# Patient Record
Sex: Male | Born: 1955 | Race: Black or African American | Hispanic: No | Smoking: Current every day smoker
Health system: Southern US, Community
[De-identification: ages and names within clinical notes are randomized; demographics above are authoritative.]

## PROBLEM LIST (undated history)

## (undated) DIAGNOSIS — E7140 Disorder of carnitine metabolism, unspecified: Secondary | ICD-10-CM

## (undated) DIAGNOSIS — E785 Hyperlipidemia, unspecified: Secondary | ICD-10-CM

## (undated) DIAGNOSIS — G9340 Encephalopathy, unspecified: Secondary | ICD-10-CM

## (undated) DIAGNOSIS — K219 Gastro-esophageal reflux disease without esophagitis: Secondary | ICD-10-CM

## (undated) DIAGNOSIS — M109 Gout, unspecified: Secondary | ICD-10-CM

## (undated) DIAGNOSIS — F22 Delusional disorders: Secondary | ICD-10-CM

## (undated) DIAGNOSIS — I10 Essential (primary) hypertension: Secondary | ICD-10-CM

## (undated) DIAGNOSIS — F259 Schizoaffective disorder, unspecified: Secondary | ICD-10-CM

## (undated) DIAGNOSIS — G259 Extrapyramidal and movement disorder, unspecified: Secondary | ICD-10-CM

## (undated) DIAGNOSIS — E58 Dietary calcium deficiency: Secondary | ICD-10-CM

## (undated) DIAGNOSIS — K5900 Constipation, unspecified: Secondary | ICD-10-CM

---

## 2014-12-12 ENCOUNTER — Emergency Department: Payer: Self-pay | Admitting: Emergency Medicine

## 2015-01-15 ENCOUNTER — Other Ambulatory Visit: Admit: 2015-01-15 | Disposition: A | Discharge status: Home / Self Care | Payer: Self-pay

## 2015-01-15 LAB — BASIC METABOLIC PANEL
ANION GAP: 3 — AB (ref 7–16)
BUN: 28 mg/dL — ABNORMAL HIGH
CALCIUM: 9.2 mg/dL
Chloride: 113 mmol/L — ABNORMAL HIGH
Co2: 27 mmol/L
Creatinine: 6.81 mg/dL — ABNORMAL HIGH
EGFR (African American): 9 — ABNORMAL LOW
GFR CALC NON AF AMER: 8 — AB
GLUCOSE: 92 mg/dL
Potassium: 4.8 mmol/L
Sodium: 143 mmol/L

## 2015-01-19 ENCOUNTER — Other Ambulatory Visit: Admit: 2015-01-19 | Disposition: A | Discharge status: Home / Self Care | Payer: Self-pay

## 2015-01-19 LAB — URINALYSIS, COMPLETE
BILIRUBIN, UR: NEGATIVE
Glucose,UR: NEGATIVE mg/dL (ref 0–75)
NITRITE: NEGATIVE
Ph: 6 (ref 4.5–8.0)
Protein: NEGATIVE
Specific Gravity: 1.006 (ref 1.003–1.030)

## 2015-01-21 LAB — URINE CULTURE

## 2015-02-06 ENCOUNTER — Encounter: Payer: Self-pay | Admitting: *Deleted

## 2015-02-06 ENCOUNTER — Emergency Department: Payer: Medicare Other

## 2015-02-06 ENCOUNTER — Inpatient Hospital Stay
Admission: EM | Admit: 2015-02-06 | Discharge: 2015-02-27 | DRG: 871 | Disposition: E | Payer: Medicare Other | Attending: Specialist | Admitting: Specialist

## 2015-02-06 ENCOUNTER — Inpatient Hospital Stay: Payer: Medicare Other

## 2015-02-06 DIAGNOSIS — E870 Hyperosmolality and hypernatremia: Secondary | ICD-10-CM | POA: Diagnosis present

## 2015-02-06 DIAGNOSIS — J9600 Acute respiratory failure, unspecified whether with hypoxia or hypercapnia: Secondary | ICD-10-CM | POA: Diagnosis not present

## 2015-02-06 DIAGNOSIS — M109 Gout, unspecified: Secondary | ICD-10-CM | POA: Diagnosis present

## 2015-02-06 DIAGNOSIS — Z9911 Dependence on respirator [ventilator] status: Secondary | ICD-10-CM | POA: Diagnosis not present

## 2015-02-06 DIAGNOSIS — Z66 Do not resuscitate: Secondary | ICD-10-CM | POA: Diagnosis not present

## 2015-02-06 DIAGNOSIS — D638 Anemia in other chronic diseases classified elsewhere: Secondary | ICD-10-CM | POA: Diagnosis present

## 2015-02-06 DIAGNOSIS — Z79899 Other long term (current) drug therapy: Secondary | ICD-10-CM

## 2015-02-06 DIAGNOSIS — Z91013 Allergy to seafood: Secondary | ICD-10-CM

## 2015-02-06 DIAGNOSIS — I959 Hypotension, unspecified: Secondary | ICD-10-CM

## 2015-02-06 DIAGNOSIS — E872 Acidosis: Secondary | ICD-10-CM | POA: Diagnosis present

## 2015-02-06 DIAGNOSIS — A419 Sepsis, unspecified organism: Principal | ICD-10-CM | POA: Diagnosis present

## 2015-02-06 DIAGNOSIS — Z515 Encounter for palliative care: Secondary | ICD-10-CM | POA: Diagnosis not present

## 2015-02-06 DIAGNOSIS — G9340 Encephalopathy, unspecified: Secondary | ICD-10-CM | POA: Diagnosis present

## 2015-02-06 DIAGNOSIS — K922 Gastrointestinal hemorrhage, unspecified: Secondary | ICD-10-CM | POA: Diagnosis present

## 2015-02-06 DIAGNOSIS — F17210 Nicotine dependence, cigarettes, uncomplicated: Secondary | ICD-10-CM | POA: Diagnosis present

## 2015-02-06 DIAGNOSIS — E860 Dehydration: Secondary | ICD-10-CM | POA: Diagnosis present

## 2015-02-06 DIAGNOSIS — J9601 Acute respiratory failure with hypoxia: Secondary | ICD-10-CM | POA: Diagnosis present

## 2015-02-06 DIAGNOSIS — I129 Hypertensive chronic kidney disease with stage 1 through stage 4 chronic kidney disease, or unspecified chronic kidney disease: Secondary | ICD-10-CM

## 2015-02-06 DIAGNOSIS — R6521 Severe sepsis with septic shock: Secondary | ICD-10-CM | POA: Diagnosis present

## 2015-02-06 DIAGNOSIS — I10 Essential (primary) hypertension: Secondary | ICD-10-CM | POA: Diagnosis present

## 2015-02-06 DIAGNOSIS — J189 Pneumonia, unspecified organism: Secondary | ICD-10-CM | POA: Diagnosis present

## 2015-02-06 DIAGNOSIS — N390 Urinary tract infection, site not specified: Secondary | ICD-10-CM | POA: Diagnosis present

## 2015-02-06 DIAGNOSIS — J9602 Acute respiratory failure with hypercapnia: Secondary | ICD-10-CM | POA: Diagnosis present

## 2015-02-06 DIAGNOSIS — N179 Acute kidney failure, unspecified: Secondary | ICD-10-CM | POA: Diagnosis not present

## 2015-02-06 DIAGNOSIS — N184 Chronic kidney disease, stage 4 (severe): Secondary | ICD-10-CM | POA: Diagnosis present

## 2015-02-06 DIAGNOSIS — K219 Gastro-esophageal reflux disease without esophagitis: Secondary | ICD-10-CM | POA: Diagnosis present

## 2015-02-06 DIAGNOSIS — F259 Schizoaffective disorder, unspecified: Secondary | ICD-10-CM | POA: Diagnosis present

## 2015-02-06 DIAGNOSIS — E669 Obesity, unspecified: Secondary | ICD-10-CM | POA: Diagnosis present

## 2015-02-06 DIAGNOSIS — D72819 Decreased white blood cell count, unspecified: Secondary | ICD-10-CM | POA: Diagnosis present

## 2015-02-06 DIAGNOSIS — R4189 Other symptoms and signs involving cognitive functions and awareness: Secondary | ICD-10-CM

## 2015-02-06 DIAGNOSIS — Z888 Allergy status to other drugs, medicaments and biological substances status: Secondary | ICD-10-CM

## 2015-02-06 DIAGNOSIS — Z6839 Body mass index (BMI) 39.0-39.9, adult: Secondary | ICD-10-CM | POA: Diagnosis not present

## 2015-02-06 DIAGNOSIS — Z880 Allergy status to penicillin: Secondary | ICD-10-CM

## 2015-02-06 DIAGNOSIS — N170 Acute kidney failure with tubular necrosis: Secondary | ICD-10-CM | POA: Diagnosis present

## 2015-02-06 DIAGNOSIS — E876 Hypokalemia: Secondary | ICD-10-CM | POA: Diagnosis present

## 2015-02-06 DIAGNOSIS — E785 Hyperlipidemia, unspecified: Secondary | ICD-10-CM | POA: Diagnosis present

## 2015-02-06 DIAGNOSIS — N189 Chronic kidney disease, unspecified: Secondary | ICD-10-CM | POA: Diagnosis present

## 2015-02-06 DIAGNOSIS — D649 Anemia, unspecified: Secondary | ICD-10-CM | POA: Diagnosis present

## 2015-02-06 HISTORY — DX: Dietary calcium deficiency: E58

## 2015-02-06 HISTORY — DX: Hyperlipidemia, unspecified: E78.5

## 2015-02-06 HISTORY — DX: Delusional disorders: F22

## 2015-02-06 HISTORY — DX: Schizoaffective disorder, unspecified: F25.9

## 2015-02-06 HISTORY — DX: Disorder of carnitine metabolism, unspecified: E71.40

## 2015-02-06 HISTORY — DX: Essential (primary) hypertension: I10

## 2015-02-06 HISTORY — DX: Extrapyramidal and movement disorder, unspecified: G25.9

## 2015-02-06 HISTORY — DX: Gastro-esophageal reflux disease without esophagitis: K21.9

## 2015-02-06 HISTORY — DX: Encephalopathy, unspecified: G93.40

## 2015-02-06 HISTORY — DX: Constipation, unspecified: K59.00

## 2015-02-06 HISTORY — DX: Gout, unspecified: M10.9

## 2015-02-06 LAB — BLOOD GAS, ARTERIAL
ACID-BASE DEFICIT: 16.9 mmol/L — AB (ref 0.0–2.0)
Acid-base deficit: 11.7 mmol/L — ABNORMAL HIGH (ref 0.0–2.0)
Acid-base deficit: 9.4 mmol/L — ABNORMAL HIGH (ref 0.0–2.0)
Allens test (pass/fail): POSITIVE — AB
Allens test (pass/fail): POSITIVE — AB
BICARBONATE: 14.8 meq/L — AB (ref 21.0–28.0)
BICARBONATE: 17.4 meq/L — AB (ref 21.0–28.0)
BICARBONATE: 22.5 meq/L (ref 21.0–28.0)
FIO2: 0.99 %
FIO2: 0.99 %
FIO2: 100 %
MECHVT: 500 mL
MECHVT: 500 mL
MECHVT: 500 mL
O2 SAT: 89.8 %
O2 Saturation: 95.5 %
O2 Saturation: 48.4 %
PATIENT TEMPERATURE: 37
PCO2 ART: 51 mmHg — AB (ref 32.0–48.0)
PCO2 ART: 89 mmHg — AB (ref 32.0–48.0)
PEEP/CPAP: 5 cmH2O
PEEP/CPAP: 15 cmH2O
PEEP: 5 cmH2O
PO2 ART: 101 mmHg (ref 83.0–108.0)
PO2 ART: 41 mmHg — AB (ref 83.0–108.0)
Patient temperature: 37
Patient temperature: 37
RATE: 16 resp/min
RATE: 20 resp/min
RATE: 15 resp/min
pCO2 arterial: 60 mmHg — ABNORMAL HIGH (ref 32.0–48.0)
pH, Arterial: 7 — CL (ref 7.350–7.450)
pH, Arterial: 7.14 — CL (ref 7.350–7.450)
pH, Arterial: 7.01 — CL (ref 7.350–7.450)
pO2, Arterial: 87 mmHg (ref 83.0–108.0)

## 2015-02-06 LAB — COMPREHENSIVE METABOLIC PANEL
ALBUMIN: 1.9 g/dL — AB (ref 3.5–5.0)
ALBUMIN: 1.5 g/dL — AB (ref 3.5–5.0)
ALT: 7 U/L — AB (ref 17–63)
ALT: 9 U/L — ABNORMAL LOW (ref 17–63)
ALT: 8 U/L — ABNORMAL LOW (ref 17–63)
ANION GAP: 11 (ref 5–15)
AST: 42 U/L — ABNORMAL HIGH (ref 15–41)
AST: 28 U/L (ref 15–41)
AST: 31 U/L (ref 15–41)
Albumin: 1.5 g/dL — ABNORMAL LOW (ref 3.5–5.0)
Alkaline Phosphatase: 44 U/L (ref 38–126)
Alkaline Phosphatase: 56 U/L (ref 38–126)
Alkaline Phosphatase: 38 U/L (ref 38–126)
Anion gap: 9 (ref 5–15)
Anion gap: 9 (ref 5–15)
BILIRUBIN TOTAL: 0.3 mg/dL (ref 0.3–1.2)
BUN: 45 mg/dL — ABNORMAL HIGH (ref 6–20)
BUN: 50 mg/dL — ABNORMAL HIGH (ref 6–20)
BUN: 47 mg/dL — AB (ref 6–20)
CALCIUM: 8.5 mg/dL — AB (ref 8.9–10.3)
CO2: 15 mmol/L — ABNORMAL LOW (ref 22–32)
CO2: 24 mmol/L (ref 22–32)
CO2: 34 mmol/L — AB (ref 22–32)
CREATININE: 4.67 mg/dL — AB (ref 0.61–1.24)
CREATININE: 4.88 mg/dL — AB (ref 0.61–1.24)
Calcium: 9 mg/dL (ref 8.9–10.3)
Calcium: 8.3 mg/dL — ABNORMAL LOW (ref 8.9–10.3)
Chloride: 127 mmol/L — ABNORMAL HIGH (ref 101–111)
Chloride: 127 mmol/L — ABNORMAL HIGH (ref 101–111)
Chloride: 122 mmol/L — ABNORMAL HIGH (ref 101–111)
Creatinine, Ser: 5.06 mg/dL — ABNORMAL HIGH (ref 0.61–1.24)
GFR calc Af Amer: 13 mL/min — ABNORMAL LOW (ref >60)
GFR calc Af Amer: 14 mL/min — ABNORMAL LOW (ref >60)
GFR calc non Af Amer: 12 mL/min — ABNORMAL LOW (ref >60)
GFR calc non Af Amer: 11 mL/min — ABNORMAL LOW (ref >60)
GFR, EST AFRICAN AMERICAN: 14 mL/min — AB (ref >60)
GFR, EST NON AFRICAN AMERICAN: 12 mL/min — AB (ref >60)
GLUCOSE: 138 mg/dL — AB (ref 65–99)
Glucose, Bld: 185 mg/dL — ABNORMAL HIGH (ref 65–99)
Glucose, Bld: 151 mg/dL — ABNORMAL HIGH (ref 65–99)
Potassium: 4.8 mmol/L (ref 3.5–5.1)
Potassium: 3.4 mmol/L — ABNORMAL LOW (ref 3.5–5.1)
Potassium: 3 mmol/L — ABNORMAL LOW (ref 3.5–5.1)
Sodium: 153 mmol/L — ABNORMAL HIGH (ref 135–145)
Sodium: 160 mmol/L — ABNORMAL HIGH (ref 135–145)
Sodium: 165 mmol/L (ref 135–145)
TOTAL PROTEIN: 5.2 g/dL — AB (ref 6.5–8.1)
Total Bilirubin: 0.6 mg/dL (ref 0.3–1.2)
Total Bilirubin: 0.5 mg/dL (ref 0.3–1.2)
Total Protein: 4.4 g/dL — ABNORMAL LOW (ref 6.5–8.1)
Total Protein: 4 g/dL — ABNORMAL LOW (ref 6.5–8.1)

## 2015-02-06 LAB — LACTIC ACID, PLASMA
Lactic Acid, Venous: 10.3 mmol/L (ref 0.5–2.0)
Lactic Acid, Venous: 4.1 mmol/L (ref 0.5–2.0)

## 2015-02-06 LAB — CBC
HCT: 35.9 % — ABNORMAL LOW (ref 40.0–52.0)
HCT: 30.7 % — ABNORMAL LOW (ref 40.0–52.0)
HEMATOCRIT: 33.9 % — AB (ref 40.0–52.0)
HEMOGLOBIN: 10.5 g/dL — AB (ref 13.0–18.0)
Hemoglobin: 11.2 g/dL — ABNORMAL LOW (ref 13.0–18.0)
Hemoglobin: 9.7 g/dL — ABNORMAL LOW (ref 13.0–18.0)
MCH: 33.1 pg (ref 26.0–34.0)
MCH: 32.4 pg (ref 26.0–34.0)
MCH: 32.2 pg (ref 26.0–34.0)
MCHC: 31.3 g/dL — AB (ref 32.0–36.0)
MCHC: 31.8 g/dL — AB (ref 32.0–36.0)
MCHC: 30.9 g/dL — AB (ref 32.0–36.0)
MCV: 105.9 fL — ABNORMAL HIGH (ref 80.0–100.0)
MCV: 102 fL — ABNORMAL HIGH (ref 80.0–100.0)
MCV: 104.1 fL — ABNORMAL HIGH (ref 80.0–100.0)
PLATELETS: 176 10*3/uL (ref 150–440)
PLATELETS: 165 10*3/uL (ref 150–440)
Platelets: 183 10*3/uL (ref 150–440)
RBC: 3.39 MIL/uL — ABNORMAL LOW (ref 4.40–5.90)
RBC: 3.01 MIL/uL — ABNORMAL LOW (ref 4.40–5.90)
RBC: 3.26 MIL/uL — ABNORMAL LOW (ref 4.40–5.90)
RDW: 24.8 % — AB (ref 11.5–14.5)
RDW: 22.3 % — AB (ref 11.5–14.5)
RDW: 24.8 % — AB (ref 11.5–14.5)
WBC: 2 10*3/uL — AB (ref 3.8–10.6)
WBC: 2 10*3/uL — AB (ref 3.8–10.6)
WBC: 0.8 10*3/uL — AB (ref 3.8–10.6)

## 2015-02-06 LAB — TROPONIN I: Troponin I: 0.4 ng/mL — ABNORMAL HIGH (ref <0.031)

## 2015-02-06 LAB — CBC WITH DIFFERENTIAL/PLATELET
BLASTS: 0 %
Band Neutrophils: 15 % — ABNORMAL HIGH (ref 0–10)
Basophils Absolute: 0 10*3/uL (ref 0.0–0.1)
Basophils Relative: 0 % (ref 0–1)
EOS ABS: 0 10*3/uL (ref 0.0–0.7)
EOS PCT: 0 % (ref 0–5)
HCT: 27.3 % — ABNORMAL LOW (ref 40.0–52.0)
Hemoglobin: 7.5 g/dL — ABNORMAL LOW (ref 13.0–18.0)
LYMPHS ABS: 3.2 10*3/uL (ref 0.7–4.0)
Lymphocytes Relative: 33 % (ref 12–46)
MCH: 33.6 pg (ref 26.0–34.0)
MCHC: 27.6 g/dL — AB (ref 32.0–36.0)
MCV: 121.8 fL — AB (ref 80.0–100.0)
MONOS PCT: 8 % (ref 3–12)
Metamyelocytes Relative: 2 %
Monocytes Absolute: 0.8 10*3/uL (ref 0.1–1.0)
Myelocytes: 0 %
NEUTROS ABS: 5.6 10*3/uL (ref 1.7–7.7)
Neutrophils Relative %: 42 % — ABNORMAL LOW (ref 43–77)
Other: 0 %
PLATELETS: 176 10*3/uL (ref 150–440)
Promyelocytes Absolute: 0 %
RBC: 2.24 MIL/uL — AB (ref 4.40–5.90)
RDW: 24.7 % — AB (ref 11.5–14.5)
WBC: 9.6 10*3/uL (ref 3.8–10.6)
nRBC: 3 /100 WBC — ABNORMAL HIGH

## 2015-02-06 LAB — BASIC METABOLIC PANEL
ANION GAP: 10 (ref 5–15)
BUN: 46 mg/dL — ABNORMAL HIGH (ref 6–20)
CALCIUM: 7.9 mg/dL — AB (ref 8.9–10.3)
CHLORIDE: 119 mmol/L — AB (ref 101–111)
CO2: 24 mmol/L (ref 22–32)
Creatinine, Ser: 5.18 mg/dL — ABNORMAL HIGH (ref 0.61–1.24)
GFR calc Af Amer: 13 mL/min — ABNORMAL LOW (ref >60)
GFR, EST NON AFRICAN AMERICAN: 11 mL/min — AB (ref >60)
GLUCOSE: 277 mg/dL — AB (ref 65–99)
POTASSIUM: 3.1 mmol/L — AB (ref 3.5–5.1)
Sodium: 153 mmol/L — ABNORMAL HIGH (ref 135–145)

## 2015-02-06 LAB — URINALYSIS COMPLETE WITH MICROSCOPIC (ARMC ONLY)
BILIRUBIN URINE: NEGATIVE
GLUCOSE, UA: NEGATIVE mg/dL
KETONES UR: NEGATIVE mg/dL
Nitrite: NEGATIVE
Protein, ur: NEGATIVE mg/dL
Specific Gravity, Urine: 1.005 (ref 1.005–1.030)
pH: 6 (ref 5.0–8.0)

## 2015-02-06 LAB — CARBOXYHEMOGLOBIN
CARBOXYHEMOGLOBIN: 1.1 % — AB (ref 1.5–9.0)
Methemoglobin: 1.9 %
Total oxygen content: 25.3 mL/dL

## 2015-02-06 LAB — GLUCOSE, CAPILLARY: Glucose-Capillary: 64 mg/dL — ABNORMAL LOW (ref 70–99)

## 2015-02-06 LAB — PHOSPHORUS
Phosphorus: 2 mg/dL — ABNORMAL LOW (ref 2.5–4.6)
Phosphorus: 1.8 mg/dL — ABNORMAL LOW (ref 2.5–4.6)

## 2015-02-06 LAB — VALPROIC ACID LEVEL: Valproic Acid Lvl: 15 ug/mL — ABNORMAL LOW (ref 50.0–100.0)

## 2015-02-06 LAB — MAGNESIUM: Magnesium: 2.1 mg/dL (ref 1.7–2.4)

## 2015-02-06 LAB — BRAIN NATRIURETIC PEPTIDE: B NATRIURETIC PEPTIDE 5: 54 pg/mL (ref 0.0–100.0)

## 2015-02-06 LAB — APTT: aPTT: 26 seconds (ref 24–36)

## 2015-02-06 LAB — PREPARE RBC (CROSSMATCH)

## 2015-02-06 LAB — ABO/RH: ABO/RH(D): A POS

## 2015-02-06 LAB — LIPASE, BLOOD: Lipase: 376 U/L — ABNORMAL HIGH (ref 22–51)

## 2015-02-06 MED ORDER — AMIODARONE HCL IN DEXTROSE 360-4.14 MG/200ML-% IV SOLN
60.0000 mg/h | INTRAVENOUS | Status: AC
  Administered 2015-02-06: 60 mg/h via INTRAVENOUS
  Filled 2015-02-06 (×2): qty 200

## 2015-02-06 MED ORDER — FLUPHENAZINE HCL 1 MG PO TABS
3.0000 mg | ORAL_TABLET | Freq: Every day | ORAL | Status: DC
  Filled 2015-02-06: qty 3

## 2015-02-06 MED ORDER — MORPHINE SULFATE 2 MG/ML IJ SOLN: 2.0000 mg | INTRAMUSCULAR | Status: DC | PRN

## 2015-02-06 MED ORDER — SODIUM CHLORIDE 0.9 % IV BOLUS (SEPSIS)
1000.0000 mL | Freq: Once | INTRAVENOUS | Status: AC
  Administered 2015-02-06: 1000 mL via INTRAVENOUS

## 2015-02-06 MED ORDER — NOREPINEPHRINE BITARTRATE 1 MG/ML IV SOLN
3.0000 ug/min | INTRAVENOUS | Status: DC
  Filled 2015-02-06: qty 4

## 2015-02-06 MED ORDER — SODIUM CHLORIDE 0.9 % IV SOLN
INTRAVENOUS | Status: AC | PRN
  Administered 2015-02-06: 1000 mL via INTRAVENOUS

## 2015-02-06 MED ORDER — CHLORHEXIDINE GLUCONATE 0.12 % MT SOLN
15.0000 mL | Freq: Two times a day (BID) | OROMUCOSAL | Status: DC
  Administered 2015-02-06: 15 mL via OROMUCOSAL

## 2015-02-06 MED ORDER — ARIPIPRAZOLE 10 MG PO TABS
20.0000 mg | ORAL_TABLET | Freq: Every day | ORAL | Status: DC
  Filled 2015-02-06: qty 2

## 2015-02-06 MED ORDER — PANTOPRAZOLE SODIUM 40 MG IV SOLR
8.0000 mg/h | INTRAVENOUS | Status: DC
  Administered 2015-02-06: 8 mg/h via INTRAVENOUS
  Filled 2015-02-06: qty 80

## 2015-02-06 MED ORDER — PANTOPRAZOLE SODIUM 40 MG IV SOLR
40.0000 mg | Freq: Once | INTRAVENOUS | Status: AC
  Administered 2015-02-06: 40 mg via INTRAVENOUS

## 2015-02-06 MED ORDER — VANCOMYCIN HCL IN DEXTROSE 1-5 GM/200ML-% IV SOLN
1000.0000 mg | INTRAVENOUS | Status: DC
  Administered 2015-02-06: 1000 mg via INTRAVENOUS
  Filled 2015-02-06 (×2): qty 200

## 2015-02-06 MED ORDER — PANTOPRAZOLE SODIUM 40 MG IV SOLR
40.0000 mg | Freq: Two times a day (BID) | INTRAVENOUS | Status: DC
  Filled 2015-02-06 (×2): qty 40

## 2015-02-06 MED ORDER — SODIUM CHLORIDE 0.9 % IV SOLN
INTRAVENOUS | Status: DC
  Administered 2015-02-06: 06:00:00 via INTRAVENOUS

## 2015-02-06 MED ORDER — DEXTROSE 50 % IV SOLN
INTRAVENOUS | Status: AC
  Administered 2015-02-06: 05:00:00
  Filled 2015-02-06: qty 50

## 2015-02-06 MED ORDER — NOREPINEPHRINE 4 MG/250ML-% IV SOLN
INTRAVENOUS | Status: AC
  Administered 2015-02-06: 4 mg
  Filled 2015-02-06: qty 250

## 2015-02-06 MED ORDER — DEXTROSE 5 % IV SOLN
0.0000 ug/min | INTRAVENOUS | Status: DC
  Administered 2015-02-06: 200 ug/min via INTRAVENOUS
  Administered 2015-02-06: 100 ug/min via INTRAVENOUS
  Filled 2015-02-06 (×2): qty 1

## 2015-02-06 MED ORDER — VANCOMYCIN HCL IN DEXTROSE 1-5 GM/200ML-% IV SOLN: 1000.0000 mg | INTRAVENOUS | Status: DC

## 2015-02-06 MED ORDER — SUCCINYLCHOLINE CHLORIDE 20 MG/ML IJ SOLN
INTRAMUSCULAR | Status: AC | PRN
  Administered 2015-02-06: 150 mg via INTRAVENOUS

## 2015-02-06 MED ORDER — CETYLPYRIDINIUM CHLORIDE 0.05 % MT LIQD
7.0000 mL | Freq: Four times a day (QID) | OROMUCOSAL | Status: DC
  Administered 2015-02-06: 7 mL via OROMUCOSAL

## 2015-02-06 MED ORDER — VECURONIUM BROMIDE 10 MG IV SOLR
10.0000 mg | Freq: Once | INTRAVENOUS | Status: AC
  Administered 2015-02-06: 10 mg via INTRAVENOUS

## 2015-02-06 MED ORDER — POTASSIUM PHOSPHATES 15 MMOLE/5ML IV SOLN: 30.0000 mmol | Freq: Once | INTRAVENOUS | Status: DC

## 2015-02-06 MED ORDER — DOPAMINE-DEXTROSE 3.2-5 MG/ML-% IV SOLN
INTRAVENOUS | Status: DC | PRN
  Administered 2015-02-06: 10 ug/kg/min via INTRAVENOUS

## 2015-02-06 MED ORDER — PRAVASTATIN SODIUM 20 MG PO TABS: 40.0000 mg | ORAL_TABLET | Freq: Every day | ORAL | Status: DC

## 2015-02-06 MED ORDER — LEVOCARNITINE 330 MG PO TABS: 330.0000 mg | ORAL_TABLET | Freq: Two times a day (BID) | ORAL | Status: DC

## 2015-02-06 MED ORDER — DOPAMINE-DEXTROSE 3.2-5 MG/ML-% IV SOLN
INTRAVENOUS | Status: DC | PRN
  Administered 2015-02-06: 20 ug/kg/min via INTRAVENOUS

## 2015-02-06 MED ORDER — DOBUTAMINE IN D5W 4-5 MG/ML-% IV SOLN
2.5000 ug/kg/min | INTRAVENOUS | Status: DC
  Administered 2015-02-06: 2.5 ug/kg/min via INTRAVENOUS
  Filled 2015-02-06: qty 250

## 2015-02-06 MED ORDER — AMIODARONE LOAD VIA INFUSION
150.0000 mg | Freq: Once | INTRAVENOUS | Status: AC
  Administered 2015-02-06: 150 mg via INTRAVENOUS

## 2015-02-06 MED ORDER — DEXTROSE 50 % IV SOLN: 25.0000 g | Freq: Once | INTRAVENOUS | Status: AC

## 2015-02-06 MED ORDER — ALLOPURINOL 100 MG PO TABS: 200.0000 mg | ORAL_TABLET | Freq: Every day | ORAL | Status: DC

## 2015-02-06 MED ORDER — SODIUM CHLORIDE 0.9 % IV SOLN: 10.0000 mL/h | Freq: Once | INTRAVENOUS | Status: DC

## 2015-02-06 MED ORDER — NOREPINEPHRINE BITARTRATE 1 MG/ML IV SOLN
3.0000 ug/min | Freq: Once | INTRAVENOUS | Status: AC
  Administered 2015-02-06: 3 ug/min via INTRAVENOUS
  Administered 2015-02-06: 10 ug/min via INTRAVENOUS
  Filled 2015-02-06: qty 4

## 2015-02-06 MED ORDER — MIDAZOLAM 50MG/50ML (1MG/ML) PREMIX INFUSION
INTRAVENOUS | Status: AC
  Administered 2015-02-06: 50 mg
  Filled 2015-02-06: qty 50

## 2015-02-06 MED ORDER — POTASSIUM CHLORIDE 2 MEQ/ML IV SOLN
INTRAVENOUS | Status: AC
  Administered 2015-02-06: 11:00:00 via INTRAVENOUS
  Filled 2015-02-06: qty 20

## 2015-02-06 MED ORDER — PANTOPRAZOLE SODIUM 40 MG IV SOLR
INTRAVENOUS | Status: AC
  Administered 2015-02-06: 40 mg via INTRAVENOUS
  Filled 2015-02-06: qty 40

## 2015-02-06 MED ORDER — NOREPINEPHRINE BITARTRATE 1 MG/ML IV SOLN
3.0000 ug/min | INTRAVENOUS | Status: DC
  Administered 2015-02-06: 280 ug/min via INTRAVENOUS
  Administered 2015-02-06: 300 ug/min via INTRAVENOUS
  Filled 2015-02-06 (×7): qty 16

## 2015-02-06 MED ORDER — ONDANSETRON HCL 4 MG PO TABS: 4.0000 mg | ORAL_TABLET | Freq: Four times a day (QID) | ORAL | Status: DC | PRN

## 2015-02-06 MED ORDER — TOLNAFTATE 1 % EX POWD: 1.0000 "application " | Freq: Every day | CUTANEOUS | Status: DC

## 2015-02-06 MED ORDER — FAMOTIDINE IN NACL 20-0.9 MG/50ML-% IV SOLN
20.0000 mg | INTRAVENOUS | Status: DC
  Filled 2015-02-06: qty 50

## 2015-02-06 MED ORDER — ACETAMINOPHEN 325 MG PO TABS: 650.0000 mg | ORAL_TABLET | Freq: Four times a day (QID) | ORAL | Status: DC | PRN

## 2015-02-06 MED ORDER — HYDROCORTISONE NA SUCCINATE PF 100 MG IJ SOLR
50.0000 mg | Freq: Four times a day (QID) | INTRAMUSCULAR | Status: DC
  Administered 2015-02-06: 50 mg via INTRAVENOUS
  Filled 2015-02-06: qty 2

## 2015-02-06 MED ORDER — FENTANYL CITRATE (PF) 100 MCG/2ML IJ SOLN
100.0000 ug | Freq: Once | INTRAMUSCULAR | Status: AC
  Administered 2015-02-06: 100 ug via INTRAVENOUS

## 2015-02-06 MED ORDER — EPINEPHRINE HCL 0.1 MG/ML IJ SOSY
1.0000 mg | PREFILLED_SYRINGE | Freq: Once | INTRAMUSCULAR | Status: AC
  Administered 2015-02-06: 1 mg via INTRAVENOUS

## 2015-02-06 MED ORDER — FLUPHENAZINE HCL 1 MG PO TABS
3.0000 mg | ORAL_TABLET | Freq: Two times a day (BID) | ORAL | Status: DC | PRN
  Filled 2015-02-06: qty 3

## 2015-02-06 MED ORDER — SODIUM BICARBONATE 8.4 % IV SOLN
100.0000 meq | Freq: Once | INTRAVENOUS | Status: AC
  Administered 2015-02-06: 100 meq via INTRAVENOUS

## 2015-02-06 MED ORDER — NOREPINEPHRINE 4 MG/250ML-% IV SOLN
INTRAVENOUS | Status: AC
  Administered 2015-02-06: 4 mg
  Filled 2015-02-06: qty 500

## 2015-02-06 MED ORDER — VECURONIUM BROMIDE 10 MG IV SOLR
INTRAVENOUS | Status: AC
Start: 2015-02-06 — End: 2015-02-06
  Filled 2015-02-06: qty 10

## 2015-02-06 MED ORDER — FENTANYL 2500MCG IN NS 250ML (10MCG/ML) PREMIX INFUSION
INTRAVENOUS | Status: AC
  Filled 2015-02-06: qty 250

## 2015-02-06 MED ORDER — SODIUM CHLORIDE 0.9 % IV BOLUS (SEPSIS): 1000.0000 mL | INTRAVENOUS | Status: DC

## 2015-02-06 MED ORDER — ACETAMINOPHEN 650 MG RE SUPP: 650.0000 mg | Freq: Four times a day (QID) | RECTAL | Status: DC | PRN

## 2015-02-06 MED ORDER — STERILE WATER FOR INJECTION IV SOLN
INTRAVENOUS | Status: DC
  Administered 2015-02-06: 10:00:00 via INTRAVENOUS
  Filled 2015-02-06 (×4): qty 850

## 2015-02-06 MED ORDER — PIPERACILLIN-TAZOBACTAM 3.375 G IVPB
3.3750 g | Freq: Three times a day (TID) | INTRAVENOUS | Status: DC
  Administered 2015-02-06 (×2): 3.375 g via INTRAVENOUS
  Filled 2015-02-06 (×5): qty 50

## 2015-02-06 MED ORDER — SODIUM BICARBONATE 8.4 % IV SOLN
50.0000 meq | INTRAVENOUS | Status: AC
  Administered 2015-02-06 (×5): 50 meq via INTRAVENOUS

## 2015-02-06 MED ORDER — SODIUM CHLORIDE 0.9 % IV BOLUS (SEPSIS): 500.0000 mL | INTRAVENOUS | Status: AC

## 2015-02-06 MED ORDER — VANCOMYCIN HCL IN DEXTROSE 1-5 GM/200ML-% IV SOLN
1000.0000 mg | INTRAVENOUS | Status: AC
  Administered 2015-02-06: 1000 mg via INTRAVENOUS
  Filled 2015-02-06: qty 200

## 2015-02-06 MED ORDER — MIDAZOLAM HCL 2 MG/2ML IJ SOLN
2.0000 mg | Freq: Once | INTRAMUSCULAR | Status: AC
  Administered 2015-02-06: 2 mg via INTRAVENOUS

## 2015-02-06 MED ORDER — DIVALPROEX SODIUM 500 MG PO DR TAB
750.0000 mg | DELAYED_RELEASE_TABLET | Freq: Two times a day (BID) | ORAL | Status: DC
  Filled 2015-02-06 (×2): qty 1

## 2015-02-06 MED ORDER — SODIUM CHLORIDE 0.9 % IV BOLUS (SEPSIS): 3000.0000 mL | INTRAVENOUS | Status: DC

## 2015-02-06 MED ORDER — DEXTROSE 5 % IV SOLN
0.0000 ug/min | INTRAVENOUS | Status: DC
  Administered 2015-02-06: 300 ug/min via INTRAVENOUS
  Filled 2015-02-06: qty 4

## 2015-02-06 MED ORDER — VASOPRESSIN 20 UNIT/ML IV SOLN
0.0300 [IU]/min | INTRAVENOUS | Status: DC
  Administered 2015-02-06: 0.03 [IU]/min via INTRAVENOUS
  Filled 2015-02-06: qty 2

## 2015-02-06 MED ORDER — AMIODARONE HCL IN DEXTROSE 360-4.14 MG/200ML-% IV SOLN
30.0000 mg/h | INTRAVENOUS | Status: DC
  Filled 2015-02-06 (×2): qty 200

## 2015-02-06 MED ORDER — CALCITRIOL 0.25 MCG PO CAPS
0.2500 ug | ORAL_CAPSULE | Freq: Every day | ORAL | Status: DC
  Filled 2015-02-06: qty 1

## 2015-02-06 MED ORDER — ONDANSETRON HCL 4 MG/2ML IJ SOLN: 4.0000 mg | Freq: Four times a day (QID) | INTRAMUSCULAR | Status: DC | PRN

## 2015-02-06 MED ORDER — FENTANYL 2500MCG IN NS 250ML (10MCG/ML) PREMIX INFUSION
25.0000 ug/h | INTRAVENOUS | Status: DC
  Administered 2015-02-06: 100 ug/h via INTRAVENOUS

## 2015-02-06 MED ORDER — SODIUM BICARBONATE 8.4 % IV SOLN
INTRAVENOUS | Status: AC
  Administered 2015-02-06: 50 meq
  Filled 2015-02-06: qty 100

## 2015-02-06 MED ORDER — NOREPINEPHRINE 4 MG/250ML-% IV SOLN
INTRAVENOUS | Status: AC
  Administered 2015-02-06: 08:00:00
  Filled 2015-02-06: qty 250

## 2015-02-06 MED ORDER — ETOMIDATE 2 MG/ML IV SOLN
INTRAVENOUS | Status: AC | PRN
  Administered 2015-02-06: 40 mg via INTRAVENOUS

## 2015-02-06 MED ORDER — IPRATROPIUM-ALBUTEROL 0.5-2.5 (3) MG/3ML IN SOLN
3.0000 mL | RESPIRATORY_TRACT | Status: DC
  Filled 2015-02-06: qty 3

## 2015-02-06 MED ORDER — EPINEPHRINE HCL 1 MG/ML IJ SOLN
0.5000 ug/min | INTRAVENOUS | Status: DC
  Administered 2015-02-06: 3 ug/min via INTRAVENOUS
  Filled 2015-02-06 (×3): qty 4

## 2015-02-07 MED FILL — Medication: Qty: 1 | Status: AC

## 2015-02-07 NOTE — Discharge Summary (Signed)
This is a death summary on patient Luis Hobbs.  This is a 59 year old male with past medical history of schizophrenia, GERD, gout, hyperlipidemia, history of chronic kidney disease stage IV who presented to the hospital due to altered mental status and encephalopathy and was noted to be in acute respiratory failure and also noted to be severely dehydrated with acute renal failure and hypernatremia. He was also noted to be in septic shock.  #1 altered mental status encephalopathy - this was likely secondary to renal failure hypernatremia and also septic shock.  Patient CT head on admission was negative for any acute abnormalities - Patient was being supported with IV fluids and broad-spectrum IV antibiotics and IV vasopressors with levophed and vasopressin  #2 septic shock - the exact source of this was unclear patient was being supported with broad-spectrum IV antibiotics, IV fluids, IV vasopressors.  #3 acute on chronic renal failure this was likely a cuticular necrosis due to septic shock and dehydration and patient was receiving aggressive IV fluids IV vasopressors, nephrology consult was obtained and they're contemplating possibly placing the patient on hemodialysis  #4 hypernatremia -this was likely secondary to severe dehydration and patient was receiving aggressive IV fluids  #5 metabolic acidosis this was likely multifactorial secondary to renal failure and lactic acidosis from septic shock and paste the patient was receiving supportive care as mentioned above  #6 is acute respiratory failure patient was intubated due to altered mental status since and encephalopathy patient was being supported with the ventilator and pulmonary was following the patient and patient was also receiving broad-spectrum IV antibiotics as mentioned above for suspected pneumonia  #7 leukopenia and anemia this was likely secondary to severe sepsis and counts were being followed.  Due to patient's critical state  and multiorgan failure a palliative care consult was obtained to discuss goals of care. Patient's family was contacted by Dr. Harvie JuniorPhifer from palliative care and she had a discussion with the patient's family about goals of care at which point thry decided to make the patient DO NOT RESUSCITATE.  Patient then shortly thereafter passed away despite aggressive medical intervention on the late afternoon of July 28, 2015 patient's family was made aware.

## 2015-02-08 LAB — TYPE AND SCREEN
ABO/RH(D): A POS
ANTIBODY SCREEN: NEGATIVE
UNIT DIVISION: 0
Unit division: 0
Unit division: 0
Unit division: 0
Unit division: 0
Unit division: 0

## 2015-02-09 LAB — URINE CULTURE

## 2015-02-11 LAB — CULTURE, BLOOD (ROUTINE X 2)
CULTURE: NO GROWTH
Culture: NO GROWTH

## 2015-02-27 NOTE — ED Notes (Addendum)
[facility]. Per EMS pt became unresponsive at 2300 today. Pt has been refusing [facility] for the [facility] and [facility].

## 2015-02-27 NOTE — Progress Notes (Signed)
Patient pronounced dead by This RN and Ernie HewJohn Perrin, RN.  No heart sounds auscultated by RNs.  Asystole on the monitor.

## 2015-02-27 NOTE — Consult Note (Signed)
Palliative Medicine Inpatient Consult Note   Name: Luis Hobbs Date: 03/07/2015 MRN: [id number]  DOB: 1956/07/30  Referring Physician: Houston Siren, MD  Palliative Care consult requested for this 59 y.o. male for goals of medical therapy in patient with CKD, morbid obesity, schizoaffective disorder, admitted with VDRF  Luis Hobbs is a 59 yo man with PMH of HTN, hyperlipidemia, CKD, obesity (BMI 39.4), schizoaffective disorder. He was sent to ER today from SNF for altered mental status. He was intubated in ER, now in CCU on 2 pressors, ARF, gap acidosis. Family not present.    REVIEW OF SYSTEMS:  Patient is not able to provide ROS  SOCIAL HISTORY:Pt is a resident of [facility]. He is from [place]. Sister is his HCPOA.   reports that he has been smoking.  He does not have any smokeless tobacco history on file. He reports that he does not drink alcohol.  CODE STATUS: Full code  PAST MEDICAL HISTORY: Past Medical History  Diagnosis Date  . Hypertension   . GERD (gastroesophageal reflux disease)   . Hyperlipemia   . Delusional disorder   . Gout   . Schizoaffective disorder   . Extrapyramidal and movement disorder   . Disorder of carnitine metabolism   . Dietary calcium deficiency   . Constipation   . Encephalopathy   . Encephalopathy     PAST SURGICAL HISTORY: History reviewed. No pertinent past surgical history.  ALLERGIES:  is allergic to fish allergy; haldol; penicillins; simvastatin; and thorazine.  MEDICATIONS:  Current Facility-Administered Medications  Medication Dose Route Frequency Provider Last Rate Last Dose  . acetaminophen (TYLENOL) tablet 650 mg  650 mg Oral Q6H PRN Wyatt Haste, MD       Or  . acetaminophen (TYLENOL) suppository 650 mg  650 mg Rectal Q6H PRN Wyatt Haste, MD      . amiodarone (NEXTERONE PREMIX) 360 MG/200ML (1.8 mg/mL) IV infusion  60 mg/hr Intravenous Continuous Erin Fulling, MD 33.3 mL/hr at Mar 07, 2015 1014 60 mg/hr at 03/07/15  1014   Followed by  . amiodarone (NEXTERONE PREMIX) 360 MG/200ML (1.8 mg/mL) IV infusion  30 mg/hr Intravenous Continuous Erin Fulling, MD      . antiseptic oral rinse (CPC / CETYLPYRIDINIUM CHLORIDE 0.05%) solution 7 mL  7 mL Mouth Rinse QID Wyatt Haste, MD   7 mL at March 07, 2015 1200  . chlorhexidine (PERIDEX) 0.12 % solution 15 mL  15 mL Mouth Rinse BID Wyatt Haste, MD   15 mL at 03-07-2015 0959  . EPINEPHrine (ADRENALIN) 4 mg in dextrose 5 % 250 mL (0.016 mg/mL) infusion  0.5-20 mcg/min Intravenous Titrated Erin Fulling, MD      . famotidine (PEPCID) IVPB 20 mg premix  20 mg Intravenous Q24H Erin Fulling, MD      . fentaNYL 10 mcg/ml infusion           . fentaNYL in NS (55mcg/ml) infusion-PREMIX  25 mcg/hr Intravenous Continuous Wyatt Haste, MD 10 mL/hr at 03-07-15 0656 100 mcg/hr at 2015/03/07 0656  . fluPHENAZine (PROLIXIN) tablet 3 mg  3 mg Oral QHS Wyatt Haste, MD      . fluPHENAZine (PROLIXIN) tablet 3 mg  3 mg Oral BID PRN Wyatt Haste, MD      . norepinephrine (LEVOPHED) 16 mg in dextrose 5 % 250 mL (0.064 mg/mL) infusion  3 mcg/min Intravenous Titrated Erin Fulling, MD 281.3 mL/hr at 03-07-2015 1200 300 mcg/min at 2015-03-07 1200  .  pantoprazole (PROTONIX) injection 40 mg  40 mg Intravenous Q12H Erin FullingKurian Kasa, MD   40 mg at 12-Apr-2015 1115  . phenylephrine (NEO-SYNEPHRINE) 10 mg in dextrose 5 % 250 mL (0.04 mg/mL) infusion  0-400 mcg/min Intravenous Titrated Erin FullingKurian Kasa, MD 150 mL/hr at 12-Apr-2015 1220 100 mcg/min at 12-Apr-2015 1220  . piperacillin-tazobactam (ZOSYN) IVPB 3.375 g  3.375 g Intravenous 3 times per day Wyatt Hasteavid K Hower, MD   3.375 g at 12-Apr-2015 [id number]  . potassium chloride 40 mEq in dextrose 5 % 250 mL injection   Intravenous STAT Erin FullingKurian Kasa, MD      . sodium bicarbonate 150 mEq in sterile water 1,000 mL infusion   Intravenous Continuous Erin FullingKurian Kasa, MD 125 mL/hr at 12-Apr-2015 0935    . sodium chloride 0.9 % bolus 3,000 mL  3,000 mL Intravenous STAT Erin FullingKurian Kasa, MD      .  vancomycin (VANCOCIN) IVPB 1000 mg/200 mL premix  1,000 mg Intravenous Q24H Wyatt Hasteavid K Hower, MD      . vasopressin (PITRESSIN) 40 Units in sodium chloride 0.9 % 250 mL (0.16 Units/mL) infusion  0.03 Units/min Intravenous Continuous Houston SirenVivek J Sainani, MD 11.3 mL/hr at 12-Apr-2015 0833 0.03 Units/min at 12-Apr-2015 0833    Vital Signs: BP 95/55 mmHg  Pulse 102  Temp(Src) 93.7 F (34.3 C) (Rectal)  Resp 20  Ht 6' (1.829 m)  Wt 131.8 kg (290 lb 9.1 oz)  BMI 39.40 kg/m2  SpO2 94% Filed Weights   12-Apr-2015 0122 12-Apr-2015 0500  Weight: 140 kg (308 lb 10.3 oz) 131.8 kg (290 lb 9.1 oz)    Estimated body mass index is 39.4 kg/(m^2) as calculated from the following:   Height as of this encounter: 6' (1.829 m).   Weight as of this encounter: 131.8 kg (290 lb 9.1 oz).   PHYSICAL EXAM:  [place]all: Critically ill appearing, obese HEENT: ETT in place Neck: Trachea midline  Cardiovascular: regular rate, tachycardic Pulmonary/Chest: Poor air movemnt ant fields, no audible wheeze Abdominal: Soft, hypoactive bowel sounds GU: Foley present Extremities: + edema BLE's Neurological: unable to assess Skin: no rashes Psychiatric: sedated  LABS: CBC:  Recent Labs Lab 12-Apr-2015 0549 12-Apr-2015 0840  WBC 2.0* 2.0*  HGB 11.2* 9.7*  HCT 35.9* 30.7*  PLT 176 165   Comprehensive Metabolic Panel:  Recent Labs Lab 12-Apr-2015 0549 12-Apr-2015 0840  NA 160* 165*  K 3.4* 3.0*  CL 127* 122*  CO2 24 34*  GLUCOSE 185* 151*  BUN 50* 47*  CREATININE 5.06* 4.88*  CALCIUM 9.0 8.3*  MG  --  2.1  AST 28 31  ALT 9* 8*  ALKPHOS 56 38  BILITOT 0.5 0.3    IMPRESSION:  Luis Hobbs is a 59 yo man with PMH of HTN, hyperlipidemia, CKD, obesity (BMI 39.4), schizoaffective disorder. He was sent to ER today from SNF for altered mental status. He was intubated in ER, now in CCU on 2 pressors, ARF, gap acidosis. Prognosis poor.  [facility] They say that pt came to SNF for STR in 07/2014 but has  transitioned to longterm care. [facility], most recently 5 days pta for ~ 3 weeks. [facility] but pt has refused meds since returning to SNF.   I spoke with pt's sister, Thresa Rossatricia West [phone]), who is pt's HCPOA. She is en route to hospital but is 2 hrs away. I updated her on pt's current  critical condition and discussed code status. She wants pt to remain a full code for now. Will meet with her on arrival to hospital.   PLAN: 1. Family meeting when sister available 2. Full code   More than 50% of the visit was spent in counseling/coordination of care: YES  Time spent: 75 minutes

## 2015-02-27 NOTE — Progress Notes (Signed)
Chaplain met with family members after the death of patient. Page was canceled as the nurse thought the pastor of the family was present. Chaplain offered prayer, grief support along with emotional support. Loralyn Freshwater D. Alroy Dust Wednesday 11-Feb-2015   02/11/2015 1700  Clinical Encounter Type  Visited With Family  Visit Type Psychological support;Spiritual support;Code (Code canceled, nurse states there is a Theme park manager in the family)  Referral From Family  Consult/Referral To Chaplain  Spiritual Encounters  Spiritual Needs Prayer;Emotional;Grief support  Stress Factors  Patient Stress Factors Loss (Patient died)  Family Stress Factors Loss (Patient died)

## 2015-02-27 NOTE — Consult Note (Signed)
PULMONARY / CRITICAL CARE MEDICINE   Name: Luis Hobbs MRN: [id number] DOB: [birth year]    ADMISSION DATE:  12-May-2015 CONSULTATION DATE:  2015-07-09    CHIEF COMPLAINT:   Acute resp failure   HISTORY OF PRESENT ILLNESS   59 y.o. Morbidly obese male with a known history of essential hypertension, gout, gastroesophageal reflux disease, hyperlipidemia, chronic kidney disease as well as various psychiatric issues presenting with altered mental status.  Patient unable to provide any meaningful information given mental status/medical condition. History obtained from documentation Patient was found unresponsive at his nursing facility [facility] no further information is obtainable at this time he was noted be hypoxic as well as unresponsive upon presentation to emergency department. Underwent rapid sequence intubation without complication performed by ED staff. Is also noted to be hypotensive requiring vasopressor therapy, he received 2 additional samples in the emergency department, as well as 2 unit packed red blood cells-given dark gastric contact recently blood (300 mL). ED course: After intubation and vasopressors lab data returned he is noted to be markedly acidotic with pH of 7.  Patient on full vent support, fio2 at 100%, on vasopressors, remains critically ill high chance of  Cardiac arrest     SIGNIFICANT EVENTS    Intubated 02/05/15    PAST MEDICAL HISTORY    :  Past Medical History  Diagnosis Date  . Hypertension   . GERD (gastroesophageal reflux disease)   . Hyperlipemia   . Delusional disorder   . Gout   . Schizoaffective disorder   . Extrapyramidal and movement disorder   . Disorder of carnitine metabolism   . Dietary calcium deficiency   . Constipation   . Encephalopathy   . Encephalopathy    History reviewed. No pertinent past surgical history. Prior to Admission medications   Medication Sig Start Date End Date Taking? Authorizing Provider   acetaminophen (TYLENOL) 325 MG tablet Take 650 mg by mouth every 8 (eight) hours as needed for mild pain, moderate pain or fever. For pain or fever 10/26/11  Yes Historical Provider, MD  allopurinol (ZYLOPRIM) 100 MG tablet Take 200 mg by mouth daily.   Yes Historical Provider, MD  ARIPiprazole (ABILIFY) 20 MG tablet Take 20 mg by mouth daily.   Yes Historical Provider, MD  calcitRIOL (ROCALTROL) 0.25 MCG capsule Take 0.25 mcg by mouth daily.   Yes Historical Provider, MD  divalproex (DEPAKOTE) 250 MG DR tablet Take 750 mg by mouth every 12 (twelve) hours.   Yes Historical Provider, MD  fluPHENAZine (PROLIXIN) 1 MG tablet Take 3 mg by mouth at bedtime.   Yes Historical Provider, MD  fluPHENAZine (PROLIXIN) 1 MG tablet Take 3 mg by mouth 2 (two) times daily as needed (for agitation).   Yes Historical Provider, MD  lactulose (CHRONULAC) 10 GM/15ML solution Take 10 g by mouth 3 (three) times daily.   Yes Historical Provider, MD  levOCARNitine (CARNITOR) 330 MG tablet Take 330 mg by mouth 2 (two) times daily.   Yes Historical Provider, MD  omeprazole (PRILOSEC) 20 MG capsule Take 20 mg by mouth 2 (two) times daily before a meal.   Yes Historical Provider, MD  pravastatin (PRAVACHOL) 40 MG tablet Take 40 mg by mouth at bedtime.   Yes Historical Provider, MD  Skin Protectants, Misc. (HYDROCERIN) LOTN Apply 1 application topically at bedtime. Apply to legs nightly.   Yes Historical Provider, MD  tolnaftate (TINACTIN) 1 % powder Apply 1 application topically daily. Apply powder between toes topically every day.  Yes Historical Provider, MD   Allergies  Allergen Reactions  . Fish Allergy   . Haldol [Haloperidol]   . Penicillins Itching  . Simvastatin   . Thorazine [Chlorpromazine]      FAMILY HISTORY   Family History  Problem Relation Age of Onset  . Family history unknown: Yes      SOCIAL HISTORY    reports that he has been smoking.  He does not have any smokeless tobacco history on  file. He reports that he does not drink alcohol. His drug history is not on file.  Review of Systems  Unable to perform ROS: critical illness      VITAL SIGNS    Temp:  [89.5 F (31.9 C)-91.2 F (32.9 C)] 91.2 F (32.9 C) (05/11 0600) Pulse Rate:  [99-138] 102 (05/11 0600) Resp:  [19-24] 20 (05/11 0600) BP: (49-102)/(28-81) 72/58 mmHg (05/11 0600) SpO2:  [86 %-100 %] 94 % (05/11 0600) FiO2 (%):  [90 %-100 %] 100 % (05/11 0800) Weight:  [290 lb 9.1 oz (131.8 kg)-308 lb 10.3 oz (140 kg)] 290 lb 9.1 oz (131.8 kg) (05/11 0500) HEMODYNAMICS:   VENTILATOR SETTINGS: Vent Mode:  [-] PRVC FiO2 (%):  [90 %-100 %] 100 % Set Rate:  [16 bmp-20 bmp] 20 bmp Vt Set:  [500 mL] 500 mL PEEP:  [5 cmH20] 5 cmH20 Plateau Pressure:  [18 cmH20] 18 cmH20 INTAKE / OUTPUT:  Intake/Output Summary (Last 24 hours) at Feb 12, 2015 0830 Last data filed at 02-12-15 0517  Gross per 24 hour  Intake   1000 ml  Output      0 ml  Net   1000 ml       PHYSICAL EXAM   Physical Exam  Constitutional: He appears distressed.  HENT:  Head: Normocephalic and atraumatic.  Eyes: No scleral icterus.  Neck: Normal range of motion. Neck supple.  Cardiovascular: Normal rate and regular rhythm.   No murmur heard. Pulmonary/Chest: He is in respiratory distress. He has no wheezes. He has rales.  resp distress  Abdominal: Soft. He exhibits no distension. There is no tenderness.  Musculoskeletal: He exhibits no edema.  Neurological: He displays normal reflexes. Coordination normal.  gcs<8T  Skin: Skin is warm. No rash noted. He is diaphoretic.       LABS   LABS:  CBC  Recent Labs Lab 02/12/15 0137  WBC 9.6  HGB 7.5*  HCT 27.3*  PLT 176   Coag's  Recent Labs Lab 12-Feb-2015 0137  APTT 26   BMET  Recent Labs Lab 02/12/15 0137  NA 153*  K 4.8  CL 127*  CO2 15*  BUN 45*  CREATININE 4.67*  GLUCOSE 138*   Electrolytes  Recent Labs Lab Feb 12, 2015 0137  CALCIUM 8.5*   Sepsis  Markers  Recent Labs Lab 02-12-15 0137 12-Feb-2015 0549  LATICACIDVEN 10.3* 4.1*   ABG  Recent Labs Lab 12-Feb-2015 0202 02/12/2015 0340  PHART 7.00* 7.14*  PCO2ART 60* 51*  PO2ART 87 101   Liver Enzymes  Recent Labs Lab 12-Feb-2015 0137  AST 42*  ALT 7*  ALKPHOS 44  BILITOT 0.6  ALBUMIN 1.5*   Cardiac Enzymes No results for input(s): TROPONINI, PROBNP in the last 168 hours. Glucose No results for input(s): GLUCAP in the last 168 hours.   No results found for this or any previous visit (from the past 240 hour(s)).    IMAGING    No results found.    Indwelling Urinary Catheter continued, requirement due to   Reason  to continue Indwelling Urinary Catheter for strict Intake/Output monitoring for hemodynamic instability   Central Line continued, requirement due to   Reason to continue [employer] Monitoring of central venous pressure or other hemodynamic parameters   Ventilator continued, requirement due to, resp failure    Ventilator Sedation RASS 0 to -2      ASSESSMENT/PLAN   59 yo morbidly obese male admitted to ICU for severe resp failure from acute septic shock with severe metabolic acidosis UTI with septic shock with acute encephalopathy   1.Respiratory Failure -continue Full MV support -continue Bronchodilator Therapy -Wean Fio2 and PEEP as tolerated   2.Renal Failure-most likely due to ATN -follow chem 7 -follow UO -continue Foley Catheter-assess need   3.Septic shock -use vasopressors to keep MAP>65 -follow ABG and LA -follow up cultures -emperic ABX -consider stress dose steroids -sepsis protocol  4.metabolic acidosis-from hypoperfusion -start bicarb infusion ASAP  5.Possible GIB -follow h/h -continue PPI  Patient with multiorgan failure-cardiovascular, resp,renal. Unable to wean from vent due to severe critical illness   I have personally obtained a history, examined the patient, evaluated laboratory and imaging results,  formulated the assessment and plan and placed orders.  The Patient requires high complexity decision making for assessment and support, frequent evaluation and titration of therapies, application of advanced monitoring technologies and extensive interpretation of multiple databases. Critical Care Time devoted to patient care services described in this note is 55  minutes.   Overall, patient is critically ill, prognosis is guarded. Patient at high risk for cardiac arrest and death.   Lucie LeatherKurian David Gwendolyn Nishi, M.D. Pulmonary & Critical Care Medicine [facility] Medical Director Intensive Care Unit   Jan 07, 2015, 8:30 AM

## 2015-02-27 NOTE — ED Notes (Signed)
Hower, MD at bedside at this time.

## 2015-02-27 NOTE — Consult Note (Signed)
Family have had time to see pt. I met with them again. They are waiting on another family member to arrive. I discussed cardiac resuscitation in the event of cardiac arrest and family agree with DNR. Will continue current aggressive care for now. Will follow up when other family arrives.

## 2015-02-27 NOTE — ED Provider Notes (Signed)
[facility] Emergency Department Provider Note  ____________________________________________  Time seen: Approximately 113AM  I have reviewed the triage vital signs and the nursing notes.   HISTORY  Chief Complaint Altered Mental Status  History and exam is limited by patient unresponsiveness   HPI Luis Hobbs is a 59 y.o. male was brought in by ambulance for unresponsiveness and hypotension. [facility] Ambulance was called out for unresponsiveness. Per EMS the patient was found to be unresponsive at approximately 2300 during second shift. They report that his blood pressure was low. EMS reports that they could not get a straight story about the patient's history and what was actually going on with him from the nursing home. The patient was recently admitted to the psychiatric unit [facility]. Per the staff at the nursing home the [facility] has been [facility] approximately one week. The patient had been refusing to take his medications for over a month. She reports that he was refusing to eat and take medications today. The patient is unable to participate in the history of the exam. Per EMS the patient's blood pressure was in the 40s systolic prior to arrival. His heart rate was in the 120s.   Past Medical History  Diagnosis Date  . Hypertension   . GERD (gastroesophageal reflux disease)   . Hyperlipemia   . Delusional disorder   . Gout   . Schizoaffective disorder   . Extrapyramidal and movement disorder   . Disorder of carnitine metabolism   . Dietary calcium deficiency   . Constipation   . Encephalopathy     Patient Active Problem List   Diagnosis Date Noted  . Upper GI bleeding 10-28-14  . Sepsis 10-28-14  . Acute respiratory failure with hypoxia and hypercapnia 10-28-14  . Essential hypertension 10-28-14  . Gout 10-28-14  . Gastroesophageal reflux disease 10-28-14  . Hyperlipidemia  10-28-14  . Chronic kidney disease 10-28-14    History reviewed. No pertinent past surgical history.  No current outpatient prescriptions on file.  Allergies Fish allergy; Haldol; Penicillins; Simvastatin; and Thorazine  Family History  Problem Relation Age of Onset  . Family history unknown: Yes    Social History History  Substance Use Topics  . Smoking status: Current Every Day Smoker  . Smokeless tobacco: Not on file  . Alcohol Use: No    Review of Systems Unable to assess due to patient unresponsiveness  ____________________________________________   PHYSICAL EXAM:  VITAL SIGNS: ED Triage Vitals  Enc Vitals Group     BP 06-07-15 0130 49/36 mmHg     Pulse Rate 06-07-15 0122 128     Resp 06-07-15 0130 23     Temp --      Temp src --      SpO2 06-07-15 0122 86 %     Weight 06-07-15 0122 308 lb 10.3 oz (140 kg)     Height 06-07-15 0122 5\' 11"  (1.803 m)     Head Cir --      Peak Flow --      Pain Score --      Pain Loc --      Pain Edu? --      Excl. in GC? --     Constitutional: Eyes open with agonal respirations, but unresponsive Eyes: Conjunctivae are normal.  Head: Atraumatic. Nose: No congestion/rhinnorhea. Mouth/Throat: Dry mucous membranes oropharynx nonerythematous Cardiovascular: Tachycardia with irregularly irregular rhythm no murmurs rubs or  gallops decreased peripheral pulses  Respiratory: Agonal respirations with coarse rhonchorous breath sounds Gastrointestinal: Soft and obese unable to assess for tenderness and decreased bowel sounds Genitourinary: Deferred Musculoskeletal: No edema or joint effusions  Neurologic:  Patient has decreased level of responsiveness. He does turn to voice does not follow commands does not withdraw to pain  Skin:  Skin is cool and dry and intact  Psychiatric: Unresponsive  ____________________________________________   LABS (all labs ordered are listed, but only abnormal results are displayed)  Labs  Reviewed  CBC WITH DIFFERENTIAL/PLATELET - Abnormal; Notable for the following:    RBC 2.24 (*)    Hemoglobin 7.5 (*)    HCT 27.3 (*)    MCV 121.8 (*)    MCHC 27.6 (*)    RDW 24.7 (*)    Neutrophils Relative % 42 (*)    Band Neutrophils 15 (*)    nRBC 3 (*)    All other components within normal limits  COMPREHENSIVE METABOLIC PANEL - Abnormal; Notable for the following:    Sodium 153 (*)    Chloride 127 (*)    CO2 15 (*)    Glucose, Bld 138 (*)    BUN 45 (*)    Creatinine, Ser 4.67 (*)    Calcium 8.5 (*)    Total Protein 4.4 (*)    Albumin 1.5 (*)    AST 42 (*)    ALT 7 (*)    GFR calc non Af Amer 12 (*)    GFR calc Af Amer 14 (*)    All other components within normal limits  LACTIC ACID, PLASMA - Abnormal; Notable for the following:    Lactic Acid, Venous 10.3 (*)    All other components within normal limits  LIPASE, BLOOD - Abnormal; Notable for the following:    Lipase 376 (*)    All other components within normal limits  BLOOD GAS, ARTERIAL - Abnormal; Notable for the following:    pH, Arterial 7.00 (*)    pCO2 arterial 60 (*)    Bicarbonate 14.8 (*)    Acid-base deficit 16.9 (*)    Allens test (pass/fail) YEAST (*)    All other components within normal limits  BLOOD GAS, ARTERIAL - Abnormal; Notable for the following:    pH, Arterial 7.14 (*)    pCO2 arterial 51 (*)    Bicarbonate 17.4 (*)    Acid-base deficit 11.7 (*)    All other components within normal limits  URINALYSIS COMPLETEWITH MICROSCOPIC ([facility])  - Abnormal; Notable for the following:    Color, Urine YELLOW (*)    APPearance HAZY (*)    Hgb urine dipstick 1+ (*)    Leukocytes, UA 3+ (*)    Bacteria, UA RARE (*)    Squamous Epithelial / LPF 0-5 (*)    All other components within normal limits  CULTURE, BLOOD (ROUTINE X 2)  CULTURE, BLOOD (ROUTINE X 2)  URINE CULTURE  APTT  BRAIN NATRIURETIC PEPTIDE  LACTIC ACID, PLASMA  TROPONIN I  COMPREHENSIVE METABOLIC PANEL  CBC  I-STAT CG4 LACTIC  ACID, ED  TYPE AND SCREEN  PREPARE RBC (CROSSMATCH)   ____________________________________________  EKG  ED ECG REPORT   Date: Feb 26, 2015  EKG Time: 120  Rate: 129  Rhythm: normal EKG, normal sinus rhythm, unchanged from previous tracings, atrial fibrillation, rate 129  Axis: Normal  Intervals:right bundle branch block  ST&T Change: Some ST depression laterally  ____________________________________________  RADIOLOGY  CT head pending ____________________________________________   PROCEDURES  Procedure(s) performed:  see procedure note(s).   INTUBATION Performed by: Lucrezia EuropeWebster,  Nashea Chumney P  Required items: required blood products, implants, devices, and special equipment available Patient identity confirmed: provided demographic data and hospital-assigned identification number Time out: Immediately prior to procedure a "time out" was called to verify the correct patient, procedure, equipment, support staff and site/side marked as required.  Indications: unresponsive, agonal breathing  Intubation method: Glidescope Laryngoscopy   Preoxygenation: BVM  Sedatives: 40mg  Etomidate Paralytic: 150mg  Succinylcholine  Tube Size: 7.5 cuffed  Post-procedure assessment: chest rise and ETCO2 monitor Breath sounds: equal and absent over the epigastrium Tube secured with: ETT holder Chest x-ray interpreted by radiologist and me.  Chest x-ray findings: endotracheal tube in appropriate position  Patient tolerated the procedure well with no immediate complications.   CENTRAL LINE Performed by: Lucrezia EuropeWebster,  Sherron Mapp P Consent: The procedure was performed in an emergent situation. Required items: required blood products, implants, devices, and special equipment available Patient identity confirmed: arm band and provided demographic data Time out: Immediately prior to procedure a "time out" was called to verify the correct patient, procedure, equipment, support staff and site/side  marked as required. Indications: vascular access Anesthesia: none Local anesthetic: none Anesthetic total: 3 ml Patient sedated: no Preparation: skin prepped with 2% chlorhexidine Skin prep agent dried: skin prep agent completely dried prior to procedure Sterile barriers: all five maximum sterile barriers used - cap, mask, sterile gown, sterile gloves, and large sterile sheet Hand hygiene: hand hygiene performed prior to central venous catheter insertion  Location details: right IJ  Catheter type: triple lumen Catheter size: 8 Fr Pre-procedure: landmarks identified Ultrasound guidance: yes Successful placement: yes Post-procedure: line sutured and dressing applied Assessment: blood return through all parts, free fluid flow, placement verified by x-ray and no pneumothorax on x-ray Patient tolerance: Patient tolerated the procedure well with no immediate complications.    Critical Care performed: Yes, see critical care note(s)   CRITICAL CARE  Performed by: Lucrezia EuropeWebster,  Shatia Sindoni P   Total critical care time: 90 minutes  Critical care time was exclusive of separately billable procedures and treating other patients.  Critical care was necessary to treat or prevent imminent or life-threatening deterioration.  Critical care was time spent personally by me on the following activities: development of treatment plan with patient and/or surrogate as well as nursing, discussions with consultants, evaluation of patient's response to treatment, examination of patient, obtaining history from patient or surrogate, ordering and performing treatments and interventions, ordering and review of laboratory studies, ordering and review of radiographic studies, pulse oximetry and re-evaluation of patient's condition.  ____________________________________________   INITIAL IMPRESSION / ASSESSMENT AND PLAN / ED COURSE  Pertinent labs & imaging results that were available during my care of the patient  were reviewed by me and considered in my medical decision making (see chart for details).  Patient is a 59 year old male who comes in today with unresponsiveness and hypotension. Initially a code sepsis was called as the patient was also hypothermic. The patient was intubated on arrival to the emergency department and as his blood pressure was low a central line was placed. Initially the patient was placed on dopamine at 10 mcg/kg/m. The patient was given 2 L of normal saline as well on arrival to the emergency department. Once he received his central line and we noticed that his hemoglobin and hematocrit were low the decision was made to give the patient emergent release of blood. The nurse reports that after the OG  tube was placed the patient had black contents in his stomach with a concern for a gastrointestinal bleed. The patient's blood pressure continued to be low so he was also started on levophed through his central line. The patient did have a blood gas done which showed a pH of 7.0, a PCO2 of 60, a PaO2 of 87, a calcium of 1.46, HCO3 of 14.8. Given the results of the patient's blood work as well as his medical status he will be admitted to the intensive care unit for further treatment and evaluation.   The patient's care was signed over to Dr. Clint Guy who will manage the patient's in the hospital. ____________________________________________   FINAL CLINICAL IMPRESSION(S) / ED DIAGNOSES  Final diagnoses:  GI bleed Unresponsive Hypotension      Rebecka Apley, MD 05-Mar-2015 [phone]

## 2015-02-27 NOTE — Progress Notes (Signed)
Pt arrived to ICU on vent, no family present.  Admit started but needs to be completed once able to speak with pt or family member.

## 2015-02-27 NOTE — Consult Note (Addendum)
Date: 02-21-2015                  Patient Name:  Luis Hobbs  MRN: [id number]  DOB: [birth year]  Age / Sex: 59 y.o., male         PCP: Keane Police, MD                 Service Requesting Consult: ICU/ Int medicine                 Reason for Consult: ARF            History of Present Illness: Patient is a 59 y.o. male with a PMHx of essential hypertension, gout, gastroesophageal reflux disease, hyperlipidemia, chronic kidney disease as well as various psychiatric issues  , who was admitted to [facility] on 02/21/2015 for evaluation of altered mental status.  Per ER notes: "Pt arrived via EMS from [facility] unresponsive. Per EMS pt became unresponsive at 2300 today. Pt has been refusing treatment for the past month at [facility] and had refused treatment while at [facility] 1 week ago" Patient is critically ill, intubated and sedated. Not able to provide any meaningful history Review of "care everywhere" reveals S Cr was 2.9 / GFR 27 back in jan 2013 Cr wasnote to be 6.81 in April 2016. Circumstances and action on these results is not available at this time  Patient has severe lactic acidosis and Anemia   Medications: Outpatient medications: Prescriptions prior to admission  Medication Sig Dispense Refill Last Dose  . acetaminophen (TYLENOL) 325 MG tablet Take 650 mg by mouth every 8 (eight) hours as needed for mild pain, moderate pain or fever. For pain or fever   02/05/2015 at 0809  . allopurinol (ZYLOPRIM) 100 MG tablet Take 200 mg by mouth daily.   02/05/2015 at 0809  . ARIPiprazole (ABILIFY) 20 MG tablet Take 20 mg by mouth daily.   02/05/2015 at 0809  . calcitRIOL (ROCALTROL) 0.25 MCG capsule Take 0.25 mcg by mouth daily.   02/05/2015 at 0809  . divalproex (DEPAKOTE) 250 MG DR tablet Take 750 mg by mouth every 12 (twelve) hours.   02/05/2015 at 1929  . fluPHENAZine (PROLIXIN) 1 MG tablet Take 3 mg by mouth at bedtime.   02/05/2015 at 1929  . fluPHENAZine (PROLIXIN) 1 MG tablet  Take 3 mg by mouth 2 (two) times daily as needed (for agitation).   unknown at unknown  . lactulose (CHRONULAC) 10 GM/15ML solution Take 10 g by mouth 3 (three) times daily.   unknown at unknown  . levOCARNitine (CARNITOR) 330 MG tablet Take 330 mg by mouth 2 (two) times daily.   02/05/2015 at 1929  . omeprazole (PRILOSEC) 20 MG capsule Take 20 mg by mouth 2 (two) times daily before a meal.   02/05/2015 at 0504  . pravastatin (PRAVACHOL) 40 MG tablet Take 40 mg by mouth at bedtime.   02/05/2015 at 1929  . Skin Protectants, Misc. (HYDROCERIN) LOTN Apply 1 application topically at bedtime. Apply to legs nightly.   02/05/2015 at 2128  . tolnaftate (TINACTIN) 1 % powder Apply 1 application topically daily. Apply powder between toes topically every day.   02/05/2015 at 1100    Current medications: Current Facility-Administered Medications  Medication Dose Route Frequency Provider Last Rate Last Dose  . acetaminophen (TYLENOL) tablet 650 mg  650 mg Oral Q6H PRN Wyatt Haste, MD       Or  . acetaminophen (TYLENOL) suppository 650 mg  650 mg Rectal Q6H PRN Wyatt Hasteavid K Hower, MD      . amiodarone (NEXTERONE PREMIX) 360 MG/200ML (1.8 mg/mL) IV infusion  60 mg/hr Intravenous Continuous Erin FullingKurian Kasa, MD 33.3 mL/hr at October 24, 2014 1014 60 mg/hr at October 24, 2014 1014   Followed by  . amiodarone (NEXTERONE PREMIX) 360 MG/200ML (1.8 mg/mL) IV infusion  30 mg/hr Intravenous Continuous Erin FullingKurian Kasa, MD      . antiseptic oral rinse (CPC / CETYLPYRIDINIUM CHLORIDE 0.05%) solution 7 mL  7 mL Mouth Rinse QID Wyatt Hasteavid K Hower, MD      . chlorhexidine (PERIDEX) 0.12 % solution 15 mL  15 mL Mouth Rinse BID Wyatt Hasteavid K Hower, MD   15 mL at October 24, 2014 0959  . famotidine (PEPCID) IVPB 20 mg premix  20 mg Intravenous Q24H Erin FullingKurian Kasa, MD      . fentaNYL 10 mcg/ml infusion           . fentaNYL 2500mcg in NS 250mL (5910mcg/ml) infusion-PREMIX  25 mcg/hr Intravenous Continuous Wyatt Hasteavid K Hower, MD 10 mL/hr at October 24, 2014 0656 100 mcg/hr at October 24, 2014 0656  .  fluPHENAZine (PROLIXIN) tablet 3 mg  3 mg Oral QHS Wyatt Hasteavid K Hower, MD      . fluPHENAZine (PROLIXIN) tablet 3 mg  3 mg Oral BID PRN Wyatt Hasteavid K Hower, MD      . norepinephrine (LEVOPHED) 16 mg in dextrose 5 % 250 mL (0.064 mg/mL) infusion  3 mcg/min Intravenous Titrated Erin FullingKurian Kasa, MD      . pantoprazole (PROTONIX) injection 40 mg  40 mg Intravenous Q12H Erin FullingKurian Kasa, MD      . piperacillin-tazobactam (ZOSYN) IVPB 3.375 g  3.375 g Intravenous 3 times per day Wyatt Hasteavid K Hower, MD   3.375 g at October 24, 2014 [id number]  . potassium chloride 40 mEq in dextrose 5 % 250 mL injection   Intravenous STAT Erin FullingKurian Kasa, MD      . sodium bicarbonate 150 mEq in sterile water 1,000 mL infusion   Intravenous Continuous Erin FullingKurian Kasa, MD 125 mL/hr at October 24, 2014 0935    . sodium chloride 0.9 % bolus 3,000 mL  3,000 mL Intravenous STAT Erin FullingKurian Kasa, MD      . vancomycin (VANCOCIN) IVPB 1000 mg/200 mL premix  1,000 mg Intravenous Q24H Wyatt Hasteavid K Hower, MD      . vasopressin (PITRESSIN) 40 Units in sodium chloride 0.9 % 250 mL (0.16 Units/mL) infusion  0.03 Units/min Intravenous Continuous Houston SirenVivek J Sainani, MD 11.3 mL/hr at October 24, 2014 0833 0.03 Units/min at October 24, 2014 [id number]      Allergies: Allergies  Allergen Reactions  . Fish Allergy   . Haldol [Haloperidol]   . Penicillins Itching  . Simvastatin   . Thorazine [Chlorpromazine]       Past Medical History: Past Medical History  Diagnosis Date  . Hypertension   . GERD (gastroesophageal reflux disease)   . Hyperlipemia   . Delusional disorder   . Gout   . Schizoaffective disorder   . Extrapyramidal and movement disorder   . Disorder of carnitine metabolism   . Dietary calcium deficiency   . Constipation   . Encephalopathy   . Encephalopathy      Past Surgical History: History reviewed. No pertinent past surgical history.   Family History: Family History  Problem Relation Age of Onset  . Family history unknown: Yes     Social History: History   Social History  .  Marital Status: Widowed    Spouse Name: N/A  . Number of Children: N/A  . Years of  Education: N/A   Occupational History  . Not on file.   Social History Main Topics  . Smoking status: Current Every Day Smoker  . Smokeless tobacco: Not on file  . Alcohol Use: No  . Drug Use: Not on file  . Sexual Activity: Not on file   Other Topics Concern  . Not on file   Social History Narrative  . No narrative on file     Review of Systems: As per HPI  Vital Signs: Blood pressure 95/55, pulse 102, temperature 93.7 F (34.3 C), temperature source Rectal, resp. rate 20, height 6' (1.829 m), weight 131.8 kg (290 lb 9.1 oz), SpO2 94 %.  Weight trends: Filed Weights   05-08-2015 0122 05-08-2015 0500  Weight: 140 kg (308 lb 10.3 oz) 131.8 kg (290 lb 9.1 oz)    Physical Exam: General: NAD,   Head: Normocephalic, atraumatic.  Eyes: Anicteric,   Nose: Mucous membranes moist, not inflammed,    Throat: ETT in place.   Neck: No deformities, masses,.  Lungs:  Vent assisted. Fio2 100 %  Heart: Irregular, tachycardic  Abdomen:  Soft, Nondistended, non-tender.   Extremities: +pretibial edema.  Neurologic: Intubated, sedated  Skin: + skin breakdwn over feet    Lab results: Basic Metabolic Panel:  Recent Labs Lab 05-08-2015 0137 05-08-2015 0549 05-08-2015 0840  NA 153* 160* 165*  K 4.8 3.4* 3.0*  CL 127* 127* 122*  CO2 15* 24 34*  GLUCOSE 138* 185* 151*  BUN 45* 50* 47*  CREATININE 4.67* 5.06* 4.88*  CALCIUM 8.5* 9.0 8.3*  MG  --   --  2.1  PHOS  --   --  2.0*    Liver Function Tests:  Recent Labs Lab 05-08-2015 0137 05-08-2015 0549 05-08-2015 0840  AST 42* 28 31  ALT 7* 9* 8*  ALKPHOS 44 56 38  BILITOT 0.6 0.5 0.3  PROT 4.4* 5.2* 4.0*  ALBUMIN 1.5* 1.9* 1.5*    Recent Labs Lab 05-08-2015 0137  LIPASE 376*   No results for input(s): AMMONIA in the last 168 hours.  CBC:  Recent Labs Lab 05-08-2015 0137 05-08-2015 0549 05-08-2015 0840  WBC 9.6 2.0* 2.0*  NEUTROABS 5.6  --    --   HGB 7.5* 11.2* 9.7*  HCT 27.3* 35.9* 30.7*  MCV 121.8* 105.9* 102.0*  PLT 176 176 165    Cardiac Enzymes:  Recent Labs Lab 05-08-2015 0840  TROPONINI 0.40*    BNP: Invalid input(s): POCBNP  CBG:  Recent Labs Lab 05-08-2015 0456  GLUCAP 64*    Microbiology: Results for orders placed or performed during the hospital encounter of 01/19/15  Urine culture     Status: None   Collection Time: 01/19/15  5:30 PM  Result Value Ref Range Status   Micro Text Report   Final       SOURCE: SOURCE NOT INDICATED    COMMENT                   MIXED BACTERIAL ORGANISMS   COMMENT                   RESULTS SUGGESTIVE OF CONTAMINATION   ANTIBIOTIC                                                        Coagulation  Studies: No results for input(s): LABPROT, INR in the last 72 hours.  Urinalysis:  Recent Labs  02-25-15 0314  COLORURINE YELLOW*  LABSPEC 1.005  PHURINE 6.0  GLUCOSEU NEGATIVE  HGBUR 1+*  BILIRUBINUR NEGATIVE  KETONESUR NEGATIVE  PROTEINUR NEGATIVE  NITRITE NEGATIVE  LEUKOCYTESUR 3+*      Imaging: Ct Head Wo Contrast  Feb 25, 2015   CLINICAL DATA:  Altered mental status. History of hypertension. Found unresponsive.  EXAM: CT HEAD WITHOUT CONTRAST  TECHNIQUE: Contiguous axial images were obtained from the base of the skull through the vertex without intravenous contrast.  COMPARISON:  None.  FINDINGS: Diffuse cerebral atrophy. Mild ventricular dilatation consistent with central atrophy. Patchy low-attenuation changes in the deep white matter consistent with small vessel ischemia. No mass effect or midline shift. No abnormal extra-axial fluid collections. Gray-white matter junctions are distinct. Basal cisterns are not effaced. No evidence of acute intracranial hemorrhage. No depressed skull fractures. Opacification of some of the ethmoid air cells. Mastoid air cells are not opacified. Vascular calcifications. OG and endotracheal tubes are present.  IMPRESSION: No  acute intracranial abnormalities. Chronic atrophy and small vessel ischemic changes.   Electronically Signed   By: Burman Nieves M.D.   On: 02/25/2015 05:01   Dg Chest Portable 1 View  2015-02-25   CLINICAL DATA:  Unresponsive  EXAM: PORTABLE CHEST - 1 VIEW  COMPARISON:  None.  FINDINGS: The endotracheal tube tip is 3.4 cm above the carina. The nasogastric tube extends into the stomach. The right jugular central line extends into the cavoatrial junction. There is no pneumothorax. There are airspace opacities in the central and basilar regions bilaterally.  IMPRESSION: Support equipment appears satisfactorily positioned.  Central and basilar airspace opacities may represent pulmonary edema or infectious infiltrate.   Electronically Signed   By: Ellery Plunk M.D.   On: 2015/02/25 02:38      Assessment & Plan: Patient is a 59 y.o. male with a PMHx of essential hypertension, gout, gastroesophageal reflux disease, hyperlipidemia, chronic kidney disease as well as various psychiatric issues  , who was admitted to [facility] on 25-Feb-2015 for evaluation of altered mental status.  1. ARF , likely ATN secondary to Hypotension and sepsis, oliguric 2. CKD, unspecified. Last known Creatinine 6.81/GFR 9 in Apr 2016 3. Sepsis, requiring multiple antibiotics and pressors 4. Acute resp failure. Requiring vent support. Fio2 100 % 5. Hypernatremia  Plan: With severe acidosis and electrolyte disturbances and oliguria, patient would require CRRT. Due to multiorgan failure odds of surviving this illness are low. Palliative care team is working with patient's family to clarify goals of treatment/management. Will await the outcome of that discussion. If family wants aggressive care, will consult vascular and place orders for CRRT D/c bicarbonate drip D/w Dr Belia Heman Dr [place] Dr Trude Mcburney

## 2015-02-27 NOTE — Progress Notes (Signed)
[facility] [place] at [facility]   PATIENT NAME: Luis Hobbs    MR#:  [id number]  DATE OF BIRTH:  Apr 18, 1956  SUBJECTIVE:  CHIEF COMPLAINT:   Chief Complaint  Patient presents with  . Altered Mental Status   Pt. Here due to altered mental status as he was found unresponsive, encephalopathic at SNF.  Presented w/ septic shock, resp. Failure and currently critically ill with multi-organ failure.   REVIEW OF SYSTEMS:    Review of Systems  Unable to perform ROS Pt. Is critically ill and intubated & sedated.   DRUG ALLERGIES:   Allergies  Allergen Reactions  . Fish Allergy   . Haldol [Haloperidol]   . Penicillins Itching  . Simvastatin   . Thorazine [Chlorpromazine]     VITALS:  Blood pressure 95/55, pulse 102, temperature 93.7 F (34.3 C), temperature source Rectal, resp. rate 20, height 6' (1.829 m), weight 131.8 kg (290 lb 9.1 oz), SpO2 94 %.  PHYSICAL EXAMINATION:   Physical Exam  GENERAL:  59 y.o.-year-old obese male sedated & intubated but critically ill appearing.  EYES: Pupils pinpoint but reactive. No scleral icterus. HEENT: Head atraumatic, normocephalic. Oropharynx and nasopharynx clear. Dry oral mucosa, ETT in place.  NECK:  Supple, no jugular venous distention. No thyroid enlargement, no tenderness.  LUNGS: difficult to assess due to morbid obesity.  CTA anteriorly. No rales, rhonchi, wheezes. CARDIOVASCULAR: S1, S2 normal. No murmurs, rubs, or gallops.  ABDOMEN: Soft, nontender, nondistended. Bowel sounds present. No organomegaly or mass.  EXTREMITIES: No cyanosis, clubbing, + 1-2 edema b/l.   NEUROLOGIC: Sedated & intubated. Pupils pinpoint but reactive.   PSYCHIATRIC: Sedated & intubated SKIN: No obvious rash, lesion. + LE pressure ulcers b.l.    LABORATORY PANEL:   CBC  Recent Labs Lab 09-15-15 0840  WBC 2.0*  HGB 9.7*  HCT 30.7*  PLT 165    ------------------------------------------------------------------------------------------------------------------  Chemistries   Recent Labs Lab 09-15-15 0840  NA 165*  K 3.0*  CL 122*  CO2 34*  GLUCOSE 151*  BUN 47*  CREATININE 4.88*  CALCIUM 8.3*  MG 2.1  AST 31  ALT 8*  ALKPHOS 38  BILITOT 0.3   ------------------------------------------------------------------------------------------------------------------  Cardiac Enzymes  Recent Labs Lab 09-15-15 0840  TROPONINI 0.40*   ------------------------------------------------------------------------------------------------------------------  RADIOLOGY:  Ct Head Wo Contrast  2015-03-21   CLINICAL DATA:  Altered mental status. History of hypertension. Found unresponsive.  EXAM: CT HEAD WITHOUT CONTRAST  TECHNIQUE: Contiguous axial images were obtained from the base of the skull through the vertex without intravenous contrast.  COMPARISON:  None.  FINDINGS: Diffuse cerebral atrophy. Mild ventricular dilatation consistent with central atrophy. Patchy low-attenuation changes in the deep white matter consistent with small vessel ischemia. No mass effect or midline shift. No abnormal extra-axial fluid collections. Gray-white matter junctions are distinct. Basal cisterns are not effaced. No evidence of acute intracranial hemorrhage. No depressed skull fractures. Opacification of some of the ethmoid air cells. Mastoid air cells are not opacified. Vascular calcifications. OG and endotracheal tubes are present.  IMPRESSION: No acute intracranial abnormalities. Chronic atrophy and small vessel ischemic changes.   Electronically Signed   By: Burman NievesWilliam  Stevens M.D.   On: 02016-06-23 05:01   Dg Chest Portable 1 View  2015-03-21   CLINICAL DATA:  Unresponsive  EXAM: PORTABLE CHEST - 1 VIEW  COMPARISON:  None.  FINDINGS: The endotracheal tube tip is 3.4 cm above the carina. The nasogastric tube extends into the stomach. The right jugular  central line extends into the cavoatrial junction. There is no pneumothorax. There are airspace opacities in the central and basilar regions bilaterally.  IMPRESSION: Support equipment appears satisfactorily positioned.  Central and basilar airspace opacities may represent pulmonary edema or infectious infiltrate.   Electronically Signed   By: Ellery Plunkaniel R Mitchell M.D.   On: Feb 26, 2015 02:38     ASSESSMENT AND PLAN:   59 yo male w/ hx of schizophrenia, GERD, gout, hyperlipidemia, hx of CKD Stage IV, came into hospital due to AMS/encephalopathy and noted to be in acute resp failure and intubated.   Also noted to be in ARF with dehydration, hypernatremia and in septic shock.   * AMS/Encephalopathy - likely metabolic encephalopathy related to renal failure, hypernatremia, and also septic shock.  - CT head (-) for acute abnormality.  - cont. IV fluids, IV abx (Vanco, Zoysn).   - cont. Supportive care w/ IV fluids, IV Vasopresors for sepsis and follow mental status.   * Septic Shock - source unclear.  - CXR showing ?? Pneumonia.  Cont. IV VAnc,Zosyn - cont. IV fluids, IV Levophed, IV Vasopressin and follow hemodynamics.  - follow hemodynamics.   * Acute on Chronic Renal failure - likely ATN due to septic shock, dehydraiton.  - cont. IV fluids, IV vasopressors and follow hemodynamics.  - follow urine output.  Nephro consult.  ?? Need for HD but given multi-organ failure poor candidate for dialysis.   * Hypernatremia - likely due to dehydration and poor Po intake.  - cont. IV fluids and follow sodium.   * Metabolic Acidosis - likely due to renal failure/lactic acidosis from septic. Shock.  - treat underlying cause and follow serial ABG's  * Acute Resp. Failure - pt. Intubated due to AMS/Encephalopathy.  - cont. Vent support.  Pulm. Following.  - cont. IV abx for suspected pneumonia.   * Leukopenia/Anemia - likely due to sepsis.   - follow counts.  No acute bleeding.   Pt. Is Critically ill  with multi-organ failure and high risk for Cardio-resp. Arrest.  Palliative care consulted to discuss goals of care and they will have meeting with pt's sister when she arrives at the hospital.  Cont. Supportive care for now.    All the records are reviewed and case discussed with Care Management/Social Workerr. Management plans discussed with the patient, family and they are in agreement.  CODE STATUS: Full   DVT Prophylaxis: TED/SCD  TOTAL Critical Care TIME TAKING CARE OF THIS PATIENT:  35 minutes.    Houston SirenSAINANI,Aarianna Hoadley J M.D on 2015/08/22 at 12:07 PM  Between 7am to 6pm - Pager - [phone]  After 6pm go to [email or web address] - password [email or web address] [facility]  [facility]  Office  [phone]  CC: Primary care physician; Keane PoliceSLADE-HARTMAN, VENEZELA, MD

## 2015-02-27 NOTE — Consult Note (Signed)
I met with pt's sister, Reino Bellis, pt's HCPOA. Also present are pt's other 2 sisters and 3 brothers. Updated them on pt's current condition. Explained that pt is not likely to survive this illness. Discussed the options of continued aggressive medical therapy vs extubation to comfort care. Family will visit with pt. I will follow up after they have had time to discuss options.   Family expressed appreciation for meeting. All questions answered.

## 2015-02-27 NOTE — Progress Notes (Signed)
Initial Nutrition Assessment   INTERVENTION: Coordination of Care: Spoke with MD, Kasa this am; agreeable to hold nutrition support at this time  NUTRITION DIAGNOSIS:  Inadequate oral intake related to inability to eat as evidenced by NPO status  GOAL:  Provide needs based on ASPEN/SCCM guidelines  Goal for initiation of nutrition support within 24-48 hours if unable to extubate and pt medically stable  MONITOR: Energy Intake Digestive system Electrolyte and renal Profile Anthropometrics UOP  REASON FOR ASSESSMENT:  Ventilator   ASSESSMENT:  [facility] with [facility] failure currently sedated on the vent. Per MD note, pt may also have GI bleed with 300mL out OG tube; s/p 2 units pRBC. PMHx: HTN, GERD, HLD, delusional disorder, schizoaffective disorder, calcium deficiency, constipation  Pt currently NPO.  Medications: KCl, Levophed, Fentanyl, Sodium Bicarbonate, Vassopresin, Amiodarone drip  Labs: Electrolyte and Renal Profile:    Recent Labs Lab 2014-11-05 0137 2014-11-05 0549 2014-11-05 0840  BUN 45* 50* 47*  CREATININE 4.67* 5.06* 4.88*  NA 153* 160* 165*  K 4.8 3.4* 3.0*  MG  --   --  2.1  PHOS  --   --  2.0*   Glucose Profile:  Recent Labs  2014-11-05 0456  GLUCAP 64*   Protein Profile:  Recent Labs Lab 2014-11-05 0137 2014-11-05 0549 2014-11-05 0840  ALBUMIN 1.5* 1.9* 1.5*  .  Height:  Ht Readings from Last 1 Encounters:  2014-11-05 6' (1.829 m)    Weight:  Wt Readings from Last 1 Encounters:  2014-11-05 290 lb 9.1 oz (131.8 kg)    Ideal Body Weight:     Wt Readings from Last 10 Encounters:  2014-11-05 290 lb 9.1 oz (131.8 kg)    BMI:  Body mass index is 39.4 kg/(m^2).  Estimated Nutritional Needs:  Kcal:  1452-1848kcals, (11-14kcals/kg) using actual body weight of 132kg  Protein:  162-202g protein (2.0-2.5g/kg) using IBW of 80.9kg  Fluid:  2023-244027mL of fluid (25-4630mL/kg) using IBW of 80.9kg  Diet Order:   Diet NPO time specified  EDUCATION NEEDS:  No education needs identified at this time   Intake/Output Summary (Last 24 hours) at 2014-11-05 1123 Last data filed at 2014-11-05 0517  Gross per 24 hour  Intake   1000 ml  Output      0 ml  Net   1000 ml    Last BM:  none  HIGH Care Level  Leda QuailAllyson Kalese Ensz, RD, LDN Pager [phone]

## 2015-02-27 NOTE — H&P (Signed)
[facility] [place] [facility]   PATIENT NAME: Luis Hobbs    MR#:  [id number]  DATE OF BIRTH:  1956-03-12   DATE OF ADMISSION:  31-Aug-2015  PRIMARY CARE PHYSICIAN: Keane PoliceSLADE-HARTMAN, VENEZELA, MD   REQUESTING/REFERRING PHYSICIAN: Zenda AlpersWebster  CHIEF COMPLAINT:   Chief Complaint  Patient presents with  . Altered Mental Status    HISTORY OF PRESENT ILLNESS:  Luis Hobbs  is a 59 y.o. male with a known history of essential hypertension, gout, gastroesophageal reflux disease, hyperlipidemia, chronic kidney disease as well as various psychiatric issues presenting with altered mental status.  Patient unable to provide any meaningful information given mental status/medical condition. History obtained from documentation Patient was found unresponsive at his nursing facility [facility] no further information is obtainable at this time he was noted be hypoxic as well as unresponsive upon presentation to emergency department. Underwent rapid sequence intubation without complication performed by ED staff. Is also noted to be hypotensive requiring vasopressor therapy, he received 2 additional samples in the emergency department, as well as 2 unit packed red blood cells-given dark gastric contact recently blood (300 mL). ED course: After intubation and vasopressors lab data returned he is noted to be markedly acidotic with pH of 7.  PAST MEDICAL HISTORY:   Past Medical History  Diagnosis Date  . Hypertension   . GERD (gastroesophageal reflux disease)   . Hyperlipemia   . Delusional disorder   . Gout   . Schizoaffective disorder   . Extrapyramidal and movement disorder   . Disorder of carnitine metabolism   . Dietary calcium deficiency   . Constipation   . Encephalopathy     PAST SURGICAL HISTORY:  History reviewed. No pertinent past surgical history.  SOCIAL HISTORY:   History  Substance Use Topics  . Smoking status: Current Every Day Smoker   . Smokeless tobacco: Not on file  . Alcohol Use: No    FAMILY HISTORY:   Family History  Problem Relation Age of Onset  . Family history unknown: Yes    DRUG ALLERGIES:   Allergies  Allergen Reactions  . Fish Allergy   . Haldol [Haloperidol]   . Penicillins Itching  . Simvastatin   . Thorazine [Chlorpromazine]     REVIEW OF SYSTEMS:  Unobtainable given patient's mental status/medical condition.   MEDICATIONS AT HOME:   Prior to Admission medications   Not on File      VITAL SIGNS:  Blood pressure 72/61, pulse 99, temperature 89.5 F (31.9 C), temperature source Rectal, resp. rate 20, height 5\' 11"  (1.803 m), weight 308 lb 10.3 oz (140 kg), SpO2 98 %.  PHYSICAL EXAMINATION:  VITAL SIGNS: Filed Vitals:   05-14-15 0405  BP: 72/61  Pulse: 99  Temp:   Resp: 20   GENERAL:59 y.o.male critically ill given mental status/medical condition-intubated sedated HEAD: Normocephalic, atraumatic.  EYES: Pupils equal, round, sluggish reactive to light. Unable to assess extraocular muscles given mental status/medical condition. No scleral icterus.  MOUTH: Moist mucosal membrane. Dentition poor. No abscess noted.  EAR, NOSE, THROAT: Clear without exudates. No external lesions.  NECK: Supple. No thyromegaly. No nodules. No JVD.  PULMONARY: Coarse breath sounds, without wheeze rails or rhonci. Poor respiratory effort. good air entry bilaterally, currently intubated vent settings assist control 16/500/100/5 CHEST: Nontender to palpation.  CARDIOVASCULAR: S1 and S2. Tachycardic. No murmurs, rubs, or gallops. Trace edema. Pedal pulses not palpable bilaterally.  GASTROINTESTINAL: Soft, nontender, nondistended. No masses. Hypoactive bowel sounds. No  hepatosplenomegaly.  MUSCULOSKELETAL: No swelling, clubbing, or edema. Passive Range of motion full in all extremities.  NEUROLOGIC: Unable to assess given mental status/medical condition SKIN: Unstageable right heel pressure ulcer ,No  further ulceration, lesions, rashes, or cyanosis. Skin cool. Turgor poor.  PSYCHIATRIC: Unable to assess given mental status/medical condition      LABORATORY PANEL:   CBC  Recent Labs Lab 02-28-2015 0137  WBC 9.6  HGB 7.5*  HCT 27.3*  PLT 176   ------------------------------------------------------------------------------------------------------------------  Chemistries   Recent Labs Lab 02-28-2015 0137  NA 153*  K 4.8  CL 127*  CO2 15*  GLUCOSE 138*  BUN 45*  CREATININE 4.67*  CALCIUM 8.5*  AST 42*  ALT 7*  ALKPHOS 44  BILITOT 0.6   ------------------------------------------------------------------------------------------------------------------  Cardiac Enzymes No results for input(s): TROPONINI in the last 168 hours. ------------------------------------------------------------------------------------------------------------------  RADIOLOGY:  Dg Chest Portable 1 View  27-Jun-2015   CLINICAL DATA:  Unresponsive  EXAM: PORTABLE CHEST - 1 VIEW  COMPARISON:  None.  FINDINGS: The endotracheal tube tip is 3.4 cm above the carina. The nasogastric tube extends into the stomach. The right jugular central line extends into the cavoatrial junction. There is no pneumothorax. There are airspace opacities in the central and basilar regions bilaterally.  IMPRESSION: Support equipment appears satisfactorily positioned.  Central and basilar airspace opacities may represent pulmonary edema or infectious infiltrate.   Electronically Signed   By: Ellery Plunkaniel R Mitchell M.D.   On: 029-Sep-2016 02:38    EKG:  No orders found for this or any previous visit.  IMPRESSION AND PLAN:   59 year old African-American gentleman history of essential hypertension, gout, gastroesophageal reflux disease, chronic kidney disease presenting in unresponsive state. Required emergent intubation and emergency department as well as vasopressors. Noted to have 300 mL of dark material via OG.  1.Sepsis, meeting  septic criteria by heart rate, temperature, respiratory rate present on arrival. Source unknown etiology at this time (UTI on urinalysis). Code sepsis called. Panculture. Broad-spectrum antibiotics including vancomycin/Zosyn and taper antibiotics when culture data returns. He has received a 30 mL/kg IV fluid bolus. Continue IV fluid hydration to keep mean arterial pressure greater than 65. Continue pressor therapy iWe will repeat lactic acid given the initial is greater than 2.2.   2. Acute respiratory failure with hypoxia and hypercapnia: Continue full vent support wean FiO2 and PEEP as tolerated, DuoNeb treatments, fentanyl and Versed drips for sedation 3. Upper GI bleed: 300 mL coffee-ground material via OG. Received 2 unit packed red blood cell transfusion and emergency department. Protonix bolus will initiate Protonix drip at this time trend CBC every 6 hours. 4. Anion gap metabolic acidosis: Given hypotension and marked acidosis give 2 Amp bicarbonate bolus now 5. Venous thromboembolism prophylactic: SCD   All the records are reviewed and case discussed with ED provider. Management plans discussed with the patient, family and they are in agreement.  CODE STATUS: Full  TOTAL TIME TAKING CARE OF THIS PATIENT: 70 critical-care minutes.    Hower,  Mardi MainlandDavid K M.D on 27-Jun-2015 at 4:10 AM  Between 7am to 6pm - Pager - [phone]  After 6pm: House Pager: - [phone]  [facility]  Office  [phone]  CC: Primary care physician; Keane PoliceSLADE-HARTMAN, VENEZELA, MD

## 2015-02-27 NOTE — Progress Notes (Signed)
ANTIBIOTIC CONSULT NOTE - INITIAL  Pharmacy Consult for Vancomycin and Zosyn Indication: rule out sepsis  Allergies  Allergen Reactions  . Fish Allergy   . Haldol [Haloperidol]   . Penicillins Itching  . Simvastatin   . Thorazine [Chlorpromazine]     Patient Measurements: Height: 6' (182.9 cm) Weight: 290 lb 9.1 oz (131.8 kg) IBW/kg (Calculated) : 77.6 Adjusted Body Weight: 101  Vital Signs: Temp: 90.9 F (32.7 C) (05/11 0500) Temp Source: Rectal (05/11 0500) BP: 94/41 mmHg (05/11 0500) Pulse Rate: 119 (05/11 0500) Intake/Output from previous day: 05/10 0701 - 05/11 0700 In: 1000 [IV Piggyback:1000] Out: -  Intake/Output from this shift: Total I/O In: 1000 [IV Piggyback:1000] Out: -   Labs:  Recent Labs  2014/12/17 0137  WBC 9.6  HGB 7.5*  PLT 176  CREATININE 4.67*   Estimated Creatinine Clearance: 23.6 mL/min (by C-G formula based on Cr of 4.67). No results for input(s): VANCOTROUGH, VANCOPEAK, VANCORANDOM, GENTTROUGH, GENTPEAK, GENTRANDOM, TOBRATROUGH, TOBRAPEAK, TOBRARND, AMIKACINPEAK, AMIKACINTROU, AMIKACIN in the last 72 hours.   Microbiology: Recent Results (from the past 720 hour(s))  Urine culture     Status: None   Collection Time: 01/19/15  5:30 PM  Result Value Ref Range Status   Micro Text Report   Final       SOURCE: SOURCE NOT INDICATED    COMMENT                   MIXED BACTERIAL ORGANISMS   COMMENT                   RESULTS SUGGESTIVE OF CONTAMINATION   ANTIBIOTIC                                                        Medical History: Past Medical History  Diagnosis Date  . Hypertension   . GERD (gastroesophageal reflux disease)   . Hyperlipemia   . Delusional disorder   . Gout   . Schizoaffective disorder   . Extrapyramidal and movement disorder   . Disorder of carnitine metabolism   . Dietary calcium deficiency   . Constipation   . Encephalopathy   . Encephalopathy     Medications:  Anti-infectives    Start      Dose/Rate Route Frequency Ordered Stop   2014/12/17 0600  piperacillin-tazobactam (ZOSYN) IVPB 3.375 g     3.375 g 12.5 mL/hr over 240 Minutes Intravenous 3 times per day 2014/12/17 0343     2014/12/17 0545  vancomycin (VANCOCIN) IVPB 1000 mg/200 mL premix     1,000 mg 200 mL/hr over 60 Minutes Intravenous STAT 2014/12/17 0536 02/07/15 0545   2014/12/17 0345  vancomycin (VANCOCIN) IVPB 1000 mg/200 mL premix     1,000 mg 200 mL/hr over 60 Minutes Intravenous Every 24 hours 2014/12/17 0343       Assessment: 59 y/o M with CKD ordered empiric abx for sepsis.   Goal of Therapy:  Vancomycin trough level 15-20 mcg/ml  Plan:  Zosyn 3.375 g EI q 8 h. Vancomycin 1000 mg iv q 24 h with stacked dosing and a trough with the 4th dose which will not be at steady state but will serve to r/o accumulation.  Measure antibiotic drug levels at steady state Follow up culture results  Luisa Harthristy, Burris Matherne D 03/06/15,5:37 AM

## 2015-02-27 NOTE — ED Notes (Addendum)
ABG analyzer down,  Hand delivered Dr. Zenda AlpersWebster ABG results. PH-7.00, PCO2-60, PO2-87, HCO3-14.8, sO2-94.8, BE-(-16.9) Blood gas done LR on AC/16/500/+5/100%fio2 ,Allen Test passed, Second ABG hand delivered to Dr. Clint GuyHower and Dr. Zenda AlpersWebster

## 2015-02-27 NOTE — Procedures (Signed)
Arterial Line Placement: Indication: Frequent blood draws; Invasive BP monitoring.   Consent: Emergent.   Risks and benefits explained to patient and/or family in detail including risk of infection, bleeding, respiratory failure and death..   Hand washing performed prior to starting the procedure.   Procedure: An active timeout was performed and correct patient, name, & ID confirmed. Physicial exam was performed to ensure adequate perfusion.  Using sterile technique, an aterial line was inserted into the RIGHT  Femoral artery artery.  Catheter threaded and the needle was removed with appropriate blood return.  Arterial waveform was noted.  After the procedure, the patient's extremities were observed to be pink and warm.   Estimated Blood Loss: None .   Number of Attempts: 1.   Complications: None .   Operator: Kassia Demarinis.  Lucie LeatherKurian David Dereka Lueras, M.D. Pulmonary & Critical Care Medicine [facility] [place] Medical Director Intensive Care Unit

## 2015-02-27 NOTE — Progress Notes (Signed)
Unable to complete rest of admission doc due to pt's unstable condition

## 2015-02-27 DEATH — deceased

## 2015-09-28 IMAGING — CT CT HEAD W/O CM
2 series · 14 of 30 positions shown, 16 images · non-contrast
Comparison: None.

CLINICAL DATA: Altered mental status. History of hypertension.
Found unresponsive.

EXAM:
CT HEAD WITHOUT CONTRAST
TECHNIQUE: Contiguous axial images were obtained from the base of the skull
through the vertex without intravenous contrast.

[Series 3: head bone · axial · 0.43mm/px · z∈[-324,-166]mm · 8 of 99 slices shown]
[im 10/99  bone]
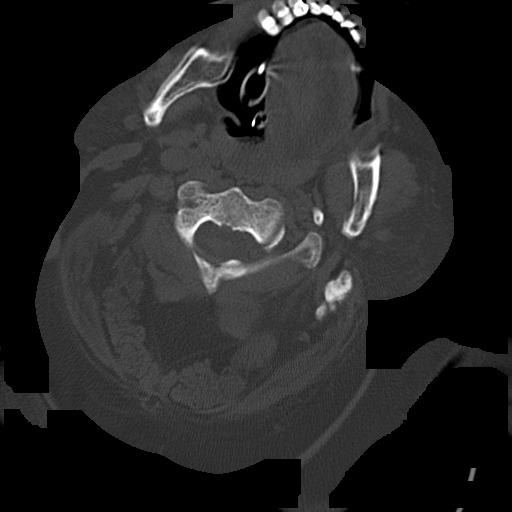
[im 19/99  bone]
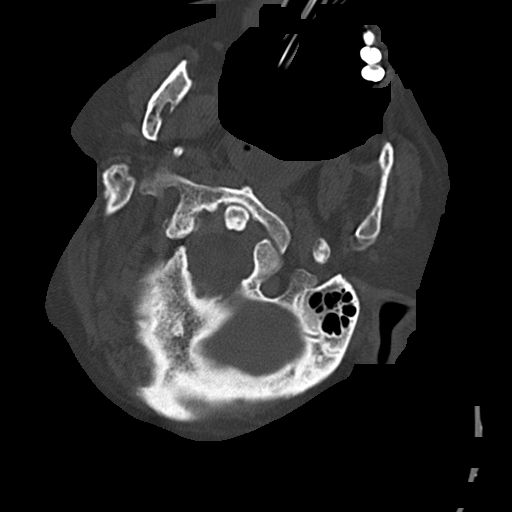
[im 33/99  bone]
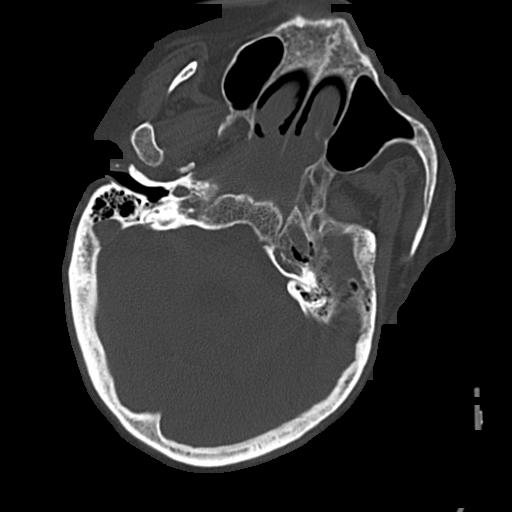
[im 43/99  bone]
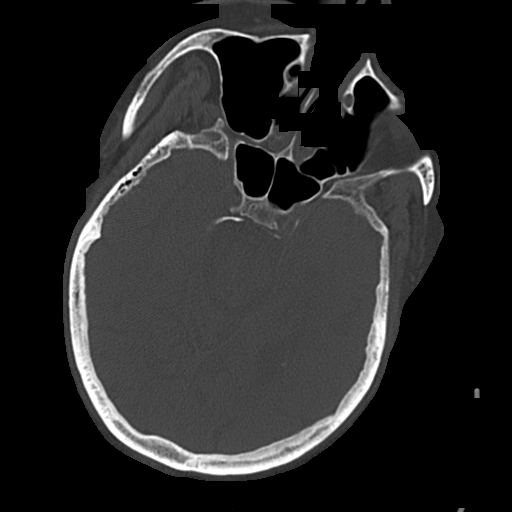
[im 57/99  bone]
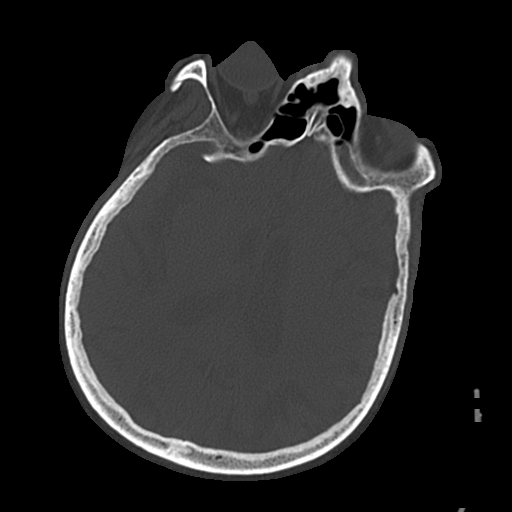
[im 66/99  bone]
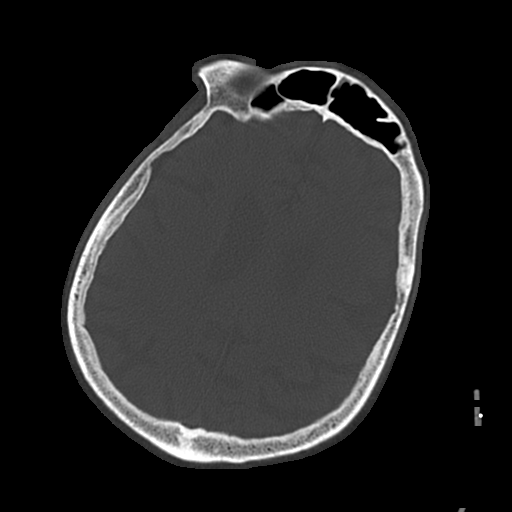
[im 80/99  bone]
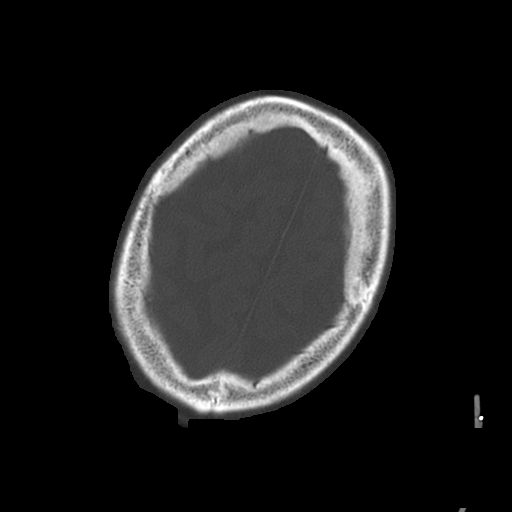
[im 89/99  bone]
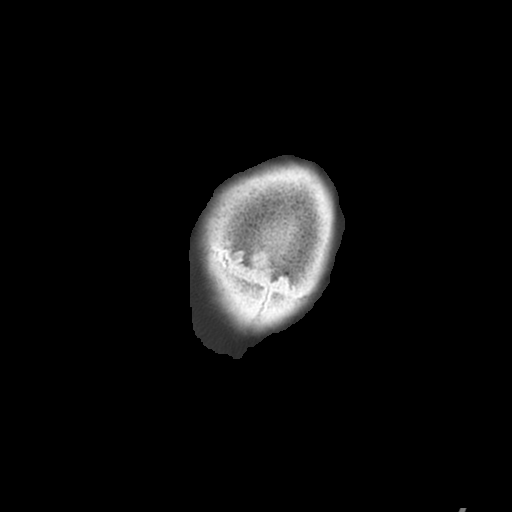

[Series 4: head wo · axial · 0.43mm/px · z∈[-308,-183]mm · 6 of 36 slices shown, 8 images]
[im 6/36  brain]
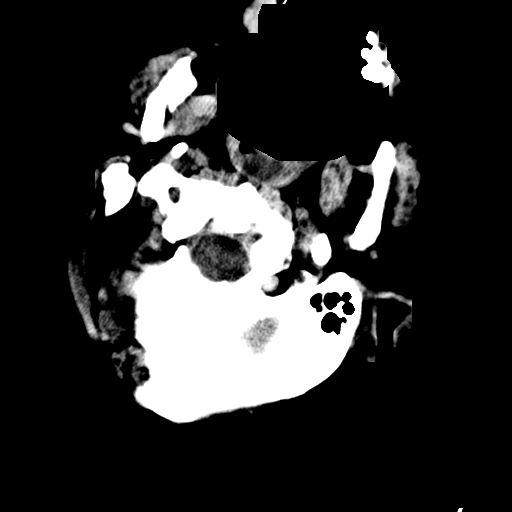
[im 6/36  bone]
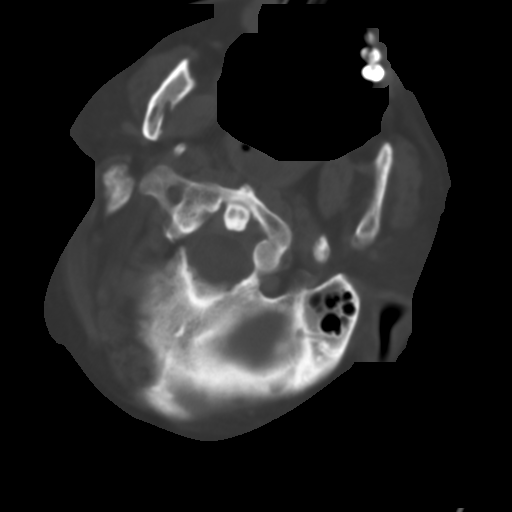
[im 11/36  brain]
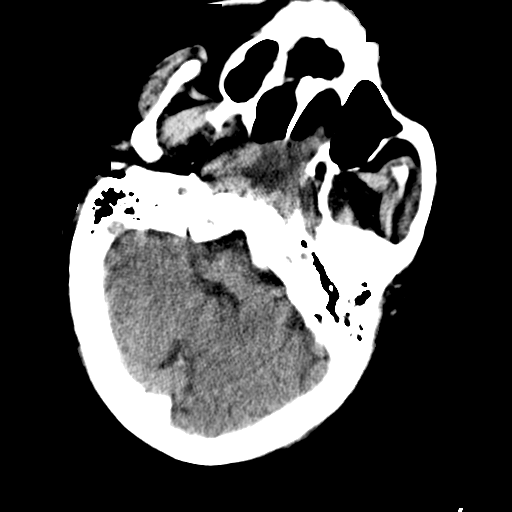
[im 16/36  brain]
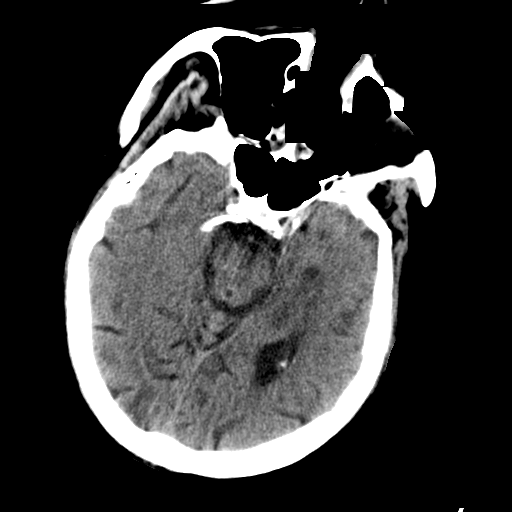
[im 21/36  brain]
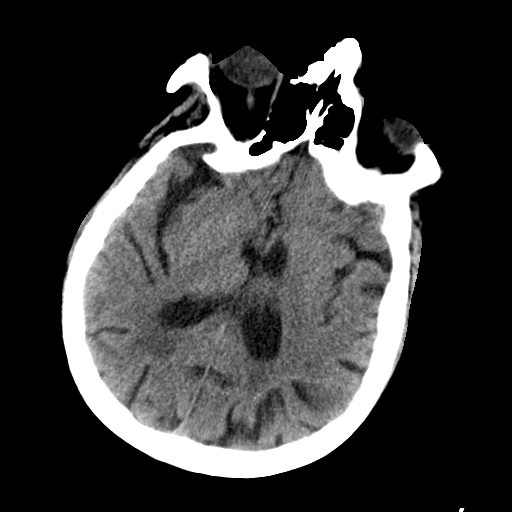
[im 26/36  brain]
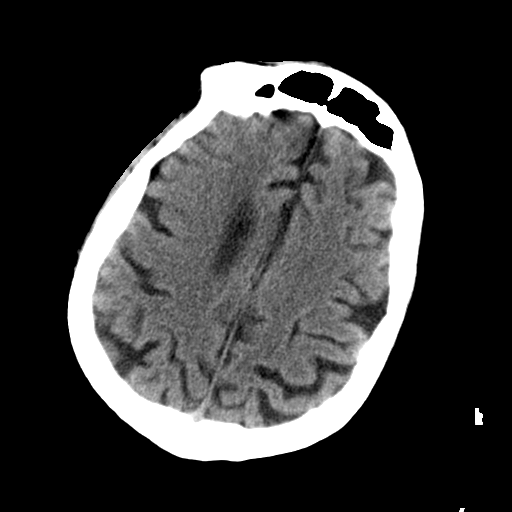
[im 26/36  bone]
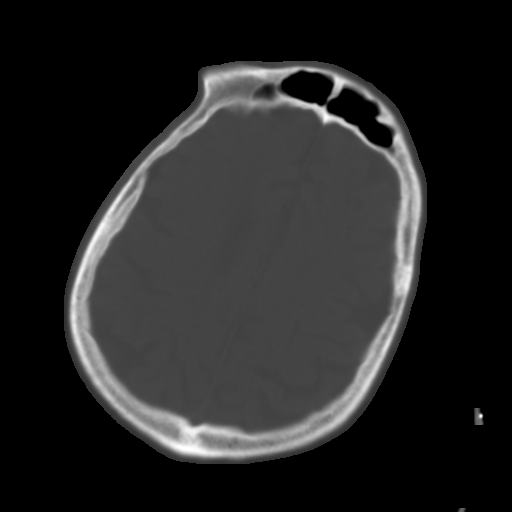
[im 31/36  brain]
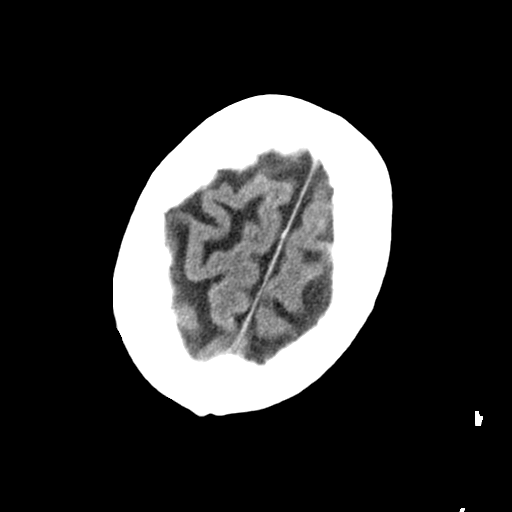

[14 of 30 positions shown; findings below may reference images not displayed]

FINDINGS: Diffuse cerebral atrophy. Mild ventricular dilatation consistent
with central atrophy. Patchy low-attenuation changes in the deep
white matter consistent with small vessel ischemia. No mass effect
or midline shift. No abnormal extra-axial fluid collections.
Gray-white matter junctions are distinct. Basal cisterns are not
effaced. No evidence of acute intracranial hemorrhage. No depressed
skull fractures. Opacification of some of the ethmoid air cells.
Mastoid air cells are not opacified. Vascular calcifications. OG and
endotracheal tubes are present.
IMPRESSION: No acute intracranial abnormalities. Chronic atrophy and small
vessel ischemic changes.
